# Patient Record
Sex: Female | Born: 1970 | Race: White | Hispanic: No | Marital: Married | Smoking: Current every day smoker
Health system: Southern US, Community
[De-identification: ages and names within clinical notes are randomized; demographics above are authoritative.]

## PROBLEM LIST (undated history)

## (undated) DIAGNOSIS — C801 Malignant (primary) neoplasm, unspecified: Secondary | ICD-10-CM

## (undated) DIAGNOSIS — C50919 Malignant neoplasm of unspecified site of unspecified female breast: Secondary | ICD-10-CM

## (undated) DIAGNOSIS — I10 Essential (primary) hypertension: Secondary | ICD-10-CM

## (undated) DIAGNOSIS — Z923 Personal history of irradiation: Secondary | ICD-10-CM

## (undated) DIAGNOSIS — K219 Gastro-esophageal reflux disease without esophagitis: Secondary | ICD-10-CM

## (undated) DIAGNOSIS — G459 Transient cerebral ischemic attack, unspecified: Secondary | ICD-10-CM

## (undated) HISTORY — DX: Transient cerebral ischemic attack, unspecified: G45.9

## (undated) HISTORY — DX: Essential (primary) hypertension: I10

## (undated) HISTORY — DX: Gastro-esophageal reflux disease without esophagitis: K21.9

## (undated) HISTORY — DX: Malignant (primary) neoplasm, unspecified: C80.1

---
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
## 2008-04-01 ENCOUNTER — Emergency Department: Payer: Self-pay | Admitting: Emergency Medicine

## 2011-06-28 ENCOUNTER — Ambulatory Visit: Payer: Self-pay | Admitting: Internal Medicine

## 2011-07-06 ENCOUNTER — Ambulatory Visit: Payer: Self-pay | Admitting: Internal Medicine

## 2012-02-25 ENCOUNTER — Emergency Department: Payer: Self-pay | Admitting: Unknown Physician Specialty

## 2012-02-25 LAB — COMPREHENSIVE METABOLIC PANEL
Anion Gap: 10 (ref 7–16)
BUN: 8 mg/dL (ref 7–18)
Calcium, Total: 8.9 mg/dL (ref 8.5–10.1)
Creatinine: 0.67 mg/dL (ref 0.60–1.30)
EGFR (African American): >60
EGFR (Non-African Amer.): >60
SGPT (ALT): 16 U/L
Sodium: 137 mmol/L (ref 136–145)

## 2012-02-25 LAB — CBC
HCT: 41.9 % (ref 35.0–47.0)
MCH: 33.3 pg (ref 26.0–34.0)
MCHC: 33.6 g/dL (ref 32.0–36.0)
Platelet: 341 10*3/uL (ref 150–440)

## 2012-02-27 ENCOUNTER — Ambulatory Visit: Payer: Self-pay | Admitting: Internal Medicine

## 2012-09-24 DIAGNOSIS — C801 Malignant (primary) neoplasm, unspecified: Secondary | ICD-10-CM

## 2012-09-24 DIAGNOSIS — Z923 Personal history of irradiation: Secondary | ICD-10-CM

## 2012-09-24 HISTORY — PX: BREAST LUMPECTOMY: SHX2

## 2012-09-24 HISTORY — DX: Malignant (primary) neoplasm, unspecified: C80.1

## 2012-09-24 HISTORY — PX: BREAST BIOPSY: SHX20

## 2012-09-24 HISTORY — DX: Personal history of irradiation: Z92.3

## 2013-01-21 ENCOUNTER — Emergency Department: Payer: Self-pay

## 2013-01-21 LAB — CBC
HCT: 39.3 % (ref 35.0–47.0)
MCHC: 33.9 g/dL (ref 32.0–36.0)
RBC: 4.14 10*6/uL (ref 3.80–5.20)
RDW: 13.4 % (ref 11.5–14.5)

## 2013-01-21 LAB — BASIC METABOLIC PANEL
Anion Gap: 6 — ABNORMAL LOW (ref 7–16)
BUN: 7 mg/dL (ref 7–18)
Chloride: 106 mmol/L (ref 98–107)
Creatinine: 0.59 mg/dL — ABNORMAL LOW (ref 0.60–1.30)
EGFR (African American): >60
EGFR (Non-African Amer.): >60
Glucose: 89 mg/dL (ref 65–99)
Osmolality: 271 (ref 275–301)
Potassium: 4.3 mmol/L (ref 3.5–5.1)
Sodium: 137 mmol/L (ref 136–145)

## 2013-01-21 LAB — TROPONIN I: Troponin-I: <0.02 ng/mL

## 2013-04-23 ENCOUNTER — Ambulatory Visit: Payer: Self-pay | Admitting: Internal Medicine

## 2013-04-28 ENCOUNTER — Ambulatory Visit: Payer: Self-pay | Admitting: Internal Medicine

## 2013-05-05 ENCOUNTER — Ambulatory Visit: Payer: Self-pay | Admitting: General Surgery

## 2013-05-26 ENCOUNTER — Encounter: Payer: Self-pay | Admitting: *Deleted

## 2013-06-02 ENCOUNTER — Encounter: Payer: Self-pay | Admitting: General Surgery

## 2013-06-02 ENCOUNTER — Ambulatory Visit (INDEPENDENT_AMBULATORY_CARE_PROVIDER_SITE_OTHER): Payer: Medicaid Other | Admitting: General Surgery

## 2013-06-02 VITALS — BP 118/68 | HR 80 | Resp 14 | Ht 65.0 in | Wt 169.0 lb

## 2013-06-02 DIAGNOSIS — R920 Mammographic microcalcification found on diagnostic imaging of breast: Secondary | ICD-10-CM

## 2013-06-02 NOTE — Patient Instructions (Addendum)
Patient to be schedule for a right breast stereotactic biopsy.   Breast Biopsy, Stereotactic A stereotactic breast biopsy takes a tissue sample from the breast with a special instrument. This is done when:  The problem (lump, abnormality, mass) can be seen on X-ray, but not felt on physical exam.  Suspicious, small calcium deposits (calcifications) are seen in the breast.  There is a change in shape or appearance of the breast, thickening, or asymmetry on mammogram (breast X-ray).  You have nipple changes (unusual or bloody discharge, crusting, retraction, dimpling).  Your caregiver is making a surgical diagnosis. The biopsy may be done on a special table, with your face down and your breasts placed through openings in the table. Computerized imaging (special form of X-rays) is used. The images are not obtained using regular X-ray film. So, exposure to radiation is reduced. Images are seen through several different angles. The surgeon removes small pieces of the suspicious tissue through a hollow needle. The tissue will be sent to the lab for analysis. The surgeon can look at the pictures right away, rather than wait for an X-ray to be developed. Your caregiver can mark the lesions (abnormal tissue formations) electronically. Then the computer can tell exactly where the problem is, or if it has moved. BENEFITS OF THE PROCEDURE  This is a good way to see if tiny lumps, abnormal looking tissue, or calcium deposits that you cannot feel are cancerous or require further treatment or follow-up.  Needle biopsy is a simple procedure. It may be performed in an outpatient imaging center. This means you have the procedure and go home the same day, without checking into a hospital.  It is less painful than open surgery. The results are as accurate as when a tissue sample is removed surgically.  The procedure is faster, less expensive, less invasive, does not distort the breast, and leaves little or no  scar.  Breast defects, which can make future mammograms hard to read and interpret, do not remain.  Recovery time is brief. Patients can soon resume their normal activities.  Using VAD (vacuum assisted device) may make it possible to remove entire lesions.  A breast biopsy can indicate if you need surgery, other treatment, or combined treatment. LET YOUR CAREGIVER KNOW ABOUT:  Allergies.  Medications taken, including herbs, eye drops, over-the-counter medications, and creams.  Use of steroids (by mouth or creams).  Previous problems with anesthetics or numbing medication.  If you are taking blood thinner medications or aspirin.  Possibility of pregnancy, if this applies.  History of blood clots (thrombophlebitis).  History of bleeding or blood problems.  Previous surgery.  Other health problems. RISKS AND COMPLICATIONS  Infection (germ growing in the wound). This can often be treated with antibiotics.  Bleeding, following surgery. Your surgeon takes every precaution to keep this from happening.  There is some concern that if a cancerous mass is present, cancer cells might be spread by the needle. Whether this actually happens is not known. It does not appear to be a significant risk.  X-ray guided breast biopsy is not infallible (not always correct). The problem may be missed or the extent of the problem may be underestimated. This would mean the biopsy did not manage to remove a piece of the diseased tissue or enough of the diseased tissue.  Lesions present, with calcium deposits scattered throughout the breast, are difficult to target by stereotactic method. Those lesions near the chest wall also are hard to learn about by this  method. If the mammogram shows only a vague change in tissue density, but no definite mass or nodule, the X-ray guided method may not be successful. Occasionally, even after a successful biopsy, the tissue diagnosis remains uncertain. A surgical  biopsy will be needed, if abnormal or precancerous cells are found on core biopsy.  Altering or deforming of the breast.  Unable to find, or missing the lesion.  Rarely, the needle may go through the chest wall into the lung area. TWO BIOPSY INSTRUMENTS MAY BE USED IN THE PROCEDURE The conventional biopsy device (core needle biopsy device) consists of an inner needle with a trough extending from it at one end, and an overlying sheath. It is attached to a spring-loaded mechanism that propels it forward. The trough fills with tissue. The outer sheath instantly moves forward to cut the tissue and keep it in the trough. Each sample is obtained in a fraction of a second. It is necessary to withdraw the needle after each sample is taken to collect the tissue.  A newer type of instrument, the VAD (vacuum assisted device), uses vacuum pressure to pull breast tissue into a needle and remove it. The needle does not need to be withdrawn after each sampling. Another advantage is that biopsies are obtained in an orderly manner, by rotating the device. This helps make sure that the entire area of interest will be sampled. When using the automated core biopsy needle, sampling is more random.  FOR COMFORT DURING THE TEST  Relax as much as possible.  Try to follow instructions, to speed up the test.  Let your caregiver know if you are uncomfortable, anxious, or in pain. PROCEDURE  You are awake during the procedure, and you go home the same day (outpatient). A specially trained radiologist will do this procedure. First, the skin is cleansed. Then, it is injected with a local anesthetic. A small nick is made in the skin, and the tip of the biopsy needle is put into the calculated site of the lesion. A special mammography machine uses ionizing radiation to help guide the radiologist's instrument to the site of the abnormal growth. At this point, stereo images are again obtained, to confirm that the needle tip is at  the problem area. Usually 5 to 10 samples are collected when doing a core biopsy. At least 12 are collected when using the vacuum assisted device (VAD). Then, a final set of images is obtained. If they show that the lesion has been mostly or completely removed, a small clip is left at the biopsy site. This is so that it can be easily located, in case the lesion turns out to be cancer. Afterward, the skin opening is stitched (sutured) or taped closed, and covered with a dressing. Your caregiver may apply a pressure dressing and an ice pack, to prevent bleeding and swelling in the breast.  X-ray guided breast biopsy can take 30 minutes to 1 hour, or more. The X-rays usually have no side effects, and no radiation remains in your body. There is usually little or no pain. Usually no scar is left from the tiny skin incision. Many women find that the major discomfort of the procedure is from lying on their stomach, or staying in 1 position for the length of the procedure. This discomfort may be reduced by carefully placed cushions. You should wear a good support bra to the procedure. You will be asked to remove jewelry, dentures, eye glasses, metal objects, or clothing that might interfere with  the X-ray images. You may want to have someone with you, to take you home after the procedure. AFTER THE PROCEDURE   After surgery, if you are doing well and have no problems, you will be allowed to go home.  You may resume your regular diet, or as directed by your caregiver. HOME CARE INSTRUCTIONS   Follow your caregiver's recommendations for medications, care of the biopsy site, follow-up appointments, and further treatment.  Only take over-the-counter or prescription medicines for pain, discomfort, or fever as directed by your caregiver.  An ice pack applied to the affected area may help with discomfort and keep the swelling down.  Change dressings as directed.  Wear a good support bra for as long as your  caregiver recommends.  Avoid strenuous activity for at least 24 hours, or as advised by your caregiver. Finding out the results of your test Not all test results are available during your visit. If your test results are not back during the visit, make an appointment with your caregiver to find out the results. Do not assume everything is normal if you have not heard from your caregiver or the medical facility. It is important for you to follow up on all of your test results.  SEEK MEDICAL CARE IF:   You develop a rash.  You have problems with your medicines.  You become lightheaded or dizzy. SEEK IMMEDIATE MEDICAL CARE IF:   There is increased bleeding (more than a small spot) from the biopsy site.  You notice redness, swelling, or increasing pain in the wound.  Pus is coming from the wound.  You have a fever.  You notice a bad smell coming from the wound or dressing.  You develop shortness of breath.  You develop chest pain.  You pass out. Document Released: 06/09/2003 Document Revised: 12/03/2011 Document Reviewed: 07/15/2009 [facility] Patient Information 2014 [place], [place].  Patient has been scheduled for a right breast stereotactic biopsy at [facility] for 06-22-13 at 1 pm. She will check-in at the [facility] at 12:30 pm. This patient is aware of date, time, and instructions. Patient verbalizes understanding.

## 2013-06-02 NOTE — Progress Notes (Signed)
Patient ID: Valerie Mcdaniel, female   DOB: 14-Mar-1971, 42 y.o.   MRN: [id number]  Chief Complaint  Patient presents with  . Breast Problem    breast pain    HPI Valerie Mcdaniel is a 42 y.o. female.  who presents for a breast evaluation. The most recent mammogram was done on 04-28-13 with a birad category 4. Patient does not perform regular self breast checks and does not get regular mammograms done. The patient states she has had left breast pain for approximately 2 months. The pain only is there when the breast is bumped or touched. She denies any other breast problems. She had a maternal aunt that had a history of breast cancer who is now deceased.   HPI  Past Medical History  Diagnosis Date  . GERD (gastroesophageal reflux disease)   . Hypertension     Past Surgical History  Procedure Laterality Date  . Cesarean section  March 1997  . Neck lesion biopsy  1989    Family History  Problem Relation Age of Onset  . Cancer Father     lung  . Cancer Maternal Aunt     breast  . Cancer Maternal Grandmother     throat    Social History History  Substance Use Topics  . Smoking status: Current Every Day Smoker -- 1.00 packs/day    Types: Cigarettes  . Smokeless tobacco: Not on file  . Alcohol Use: No    No Known Allergies  Current Outpatient Prescriptions  Medication Sig Dispense Refill  . aspirin 325 MG tablet Take 325 mg by mouth daily.      . Calcium Carbonate-Vit D-Min (CALCIUM 1200 PO) Take 1 tablet by mouth daily.      . famotidine (PEPCID) 20 MG tablet Take 20 mg by mouth daily.      Marland Kitchen lisinopril (PRINIVIL,ZESTRIL) 10 MG tablet Take 10 mg by mouth daily.      . Multiple Vitamin (MULTIVITAMIN) capsule Take 1 capsule by mouth daily.       No current facility-administered medications for this visit.    Review of Systems Review of Systems  Constitutional: Negative.   Respiratory: Negative.   Cardiovascular: Negative.     Blood pressure 118/68, pulse 80, resp. rate 14,  height 5\' 5"  (1.651 m), weight 169 lb (76.658 kg), last menstrual period 05/12/2013.  Physical Exam Physical Exam  Constitutional: She is oriented to person, place, and time. She appears well-developed and well-nourished.  Eyes: Conjunctivae are normal. No scleral icterus.  Neck: No thyromegaly present.  Cardiovascular: Normal rate, regular rhythm and normal heart sounds.   No murmur heard. Pulmonary/Chest: Effort normal and breath sounds normal. Right breast exhibits no inverted nipple, no mass, no nipple discharge, no skin change and no tenderness. Left breast exhibits no inverted nipple, no mass, no nipple discharge, no skin change and no tenderness.  Lymphadenopathy:    She has no cervical adenopathy.    She has no axillary adenopathy.  Neurological: She is alert and oriented to person, place, and time.  Skin: Skin is warm and dry.    Data Reviewed  Mammogram reviewed. Cluster of calcifications noted lateral right breast.   Assessment    Abnormal microcalcification's right breast needs stereotactic biopsy.    Plan    Patient to have a right breast stereotactic biopsy. Procedure explained to patient. Patient agreeable.     Patient has been scheduled for a right breast stereotactic biopsy at [facility] for 06-22-13 at 1 pm. She will  check-in at the [facility] at 12:30 pm. This patient is aware of date, time, and instructions. Patient verbalizes understanding.   Kadesha Virrueta G 06/02/2013, 6:35 PM

## 2013-06-22 ENCOUNTER — Ambulatory Visit: Payer: Self-pay | Admitting: General Surgery

## 2013-06-22 DIAGNOSIS — R920 Mammographic microcalcification found on diagnostic imaging of breast: Secondary | ICD-10-CM

## 2013-06-24 ENCOUNTER — Encounter: Payer: Self-pay | Admitting: General Surgery

## 2013-06-24 ENCOUNTER — Ambulatory Visit (INDEPENDENT_AMBULATORY_CARE_PROVIDER_SITE_OTHER): Payer: Medicaid Other | Admitting: General Surgery

## 2013-06-24 VITALS — BP 120/70 | HR 70 | Resp 14 | Ht 65.0 in | Wt 170.0 lb

## 2013-06-24 DIAGNOSIS — D0590 Unspecified type of carcinoma in situ of unspecified breast: Secondary | ICD-10-CM

## 2013-06-24 DIAGNOSIS — D0591 Unspecified type of carcinoma in situ of right breast: Secondary | ICD-10-CM

## 2013-06-24 NOTE — Progress Notes (Signed)
Patient here today for follow up post right breast biopsy.  Steristrip in place and aware it may come off in one week.  Minimal bruising noted.  The patient is aware that a heating pad may be used for comfort as needed.    Pathology revealed DCIS. Pt was advised fully on this. Treatment for DCIS-lumpectomy and radiation and possible antihormonal therapy discussed and explained to her. ER/PR are pending. She is also aware lumpectomy specimen may show an invasive component which will necessitate SN after.   Procedure and risks explained to her. She is agreeable.

## 2013-06-25 ENCOUNTER — Encounter: Payer: Self-pay | Admitting: General Surgery

## 2013-06-25 ENCOUNTER — Other Ambulatory Visit: Payer: Self-pay | Admitting: General Surgery

## 2013-06-25 DIAGNOSIS — D0591 Unspecified type of carcinoma in situ of right breast: Secondary | ICD-10-CM

## 2013-06-26 ENCOUNTER — Encounter: Payer: Self-pay | Admitting: General Surgery

## 2013-06-26 DIAGNOSIS — D0590 Unspecified type of carcinoma in situ of unspecified breast: Secondary | ICD-10-CM | POA: Insufficient documentation

## 2013-06-26 LAB — PATHOLOGY REPORT

## 2013-06-29 ENCOUNTER — Ambulatory Visit: Payer: Medicaid Other

## 2013-06-29 ENCOUNTER — Encounter: Payer: Self-pay | Admitting: General Surgery

## 2013-06-29 ENCOUNTER — Ambulatory Visit: Payer: Self-pay | Admitting: General Surgery

## 2013-06-29 DIAGNOSIS — D0590 Unspecified type of carcinoma in situ of unspecified breast: Secondary | ICD-10-CM

## 2013-06-29 HISTORY — PX: BREAST SURGERY: SHX581

## 2013-07-01 LAB — PATHOLOGY REPORT

## 2013-07-02 ENCOUNTER — Encounter: Payer: Self-pay | Admitting: General Surgery

## 2013-07-02 ENCOUNTER — Telehealth: Payer: Self-pay | Admitting: *Deleted

## 2013-07-02 NOTE — Telephone Encounter (Signed)
Per Dr. Evette Cristal the margins are clear and no invasive cancer, patient pleased. Follow up as scheduled next week.

## 2013-07-08 ENCOUNTER — Encounter: Payer: Self-pay | Admitting: General Surgery

## 2013-07-08 ENCOUNTER — Ambulatory Visit (INDEPENDENT_AMBULATORY_CARE_PROVIDER_SITE_OTHER): Payer: Medicaid Other | Admitting: General Surgery

## 2013-07-08 VITALS — BP 130/64 | HR 88 | Resp 14 | Ht 65.0 in | Wt 168.0 lb

## 2013-07-08 DIAGNOSIS — D0590 Unspecified type of carcinoma in situ of unspecified breast: Secondary | ICD-10-CM

## 2013-07-08 DIAGNOSIS — D0591 Unspecified type of carcinoma in situ of right breast: Secondary | ICD-10-CM

## 2013-07-08 NOTE — Progress Notes (Signed)
Patient here today for follow up post right breast lumpectomy done on 06/29/13.  No complaints except for some itching. Right breast lumpectomy site is clean and healing well.  Path- margins clear, no invasive CA. Arrange for radiation oncology consult. Believe patient would be a candidate for mammosite. This was discussed with patient in full.

## 2013-07-08 NOTE — Patient Instructions (Signed)
Follow up appointment to be announced.  

## 2013-07-09 ENCOUNTER — Telehealth: Payer: Self-pay | Admitting: *Deleted

## 2013-07-09 DIAGNOSIS — D0590 Unspecified type of carcinoma in situ of unspecified breast: Secondary | ICD-10-CM

## 2013-07-09 NOTE — Telephone Encounter (Signed)
Patient has been scheduled for an appointment to see Dr. Rushie Chestnut at the [facility] for 07-13-13 at 8 am. She is aware of date, time, and instructions.   Records have been forwarded to the Cancer Center for Dr. Kipp Laurence review.

## 2013-07-13 ENCOUNTER — Ambulatory Visit: Payer: Self-pay | Admitting: Radiation Oncology

## 2013-07-16 ENCOUNTER — Encounter: Payer: Self-pay | Admitting: General Surgery

## 2013-07-21 ENCOUNTER — Telehealth: Payer: Self-pay | Admitting: *Deleted

## 2013-07-21 NOTE — Telephone Encounter (Signed)
Patient has decided on full breast radiation and she starts next week. I told her once she is done she will need a follow up here to call back after radiation completed.

## 2013-07-25 ENCOUNTER — Ambulatory Visit: Payer: Self-pay | Admitting: Radiation Oncology

## 2013-08-05 LAB — CBC CANCER CENTER
Basophil %: 0.6 %
Eosinophil #: 0.5 x10 3/mm (ref 0.0–0.7)
Eosinophil %: 3.2 %
HGB: 15 g/dL (ref 12.0–16.0)
Lymphocyte #: 2.2 x10 3/mm (ref 1.0–3.6)
Lymphocyte %: 14.4 %
MCH: 33 pg (ref 26.0–34.0)
MCHC: 33.7 g/dL (ref 32.0–36.0)
Monocyte #: 0.7 x10 3/mm (ref 0.2–0.9)
Neutrophil #: 11.7 x10 3/mm — ABNORMAL HIGH (ref 1.4–6.5)
Neutrophil %: 77.3 %
Platelet: 387 x10 3/mm (ref 150–440)
RDW: 12.8 % (ref 11.5–14.5)

## 2013-08-12 LAB — CBC CANCER CENTER
Basophil #: 0 x10 3/mm (ref 0.0–0.1)
Basophil %: 0.2 %
Lymphocyte %: 14.4 %
MCHC: 33.7 g/dL (ref 32.0–36.0)
MCV: 98 fL (ref 80–100)
Monocyte #: 1 x10 3/mm — ABNORMAL HIGH (ref 0.2–0.9)
Monocyte %: 6.1 %
Neutrophil #: 12.4 x10 3/mm — ABNORMAL HIGH (ref 1.4–6.5)
Neutrophil %: 76.3 %
RDW: 13.3 % (ref 11.5–14.5)
WBC: 16.2 x10 3/mm — ABNORMAL HIGH (ref 3.6–11.0)

## 2013-08-19 LAB — CBC CANCER CENTER
Basophil %: 0.5 %
Eosinophil #: 0.5 x10 3/mm (ref 0.0–0.7)
HCT: 41.4 % (ref 35.0–47.0)
HGB: 14 g/dL (ref 12.0–16.0)
Lymphocyte #: 1.7 x10 3/mm (ref 1.0–3.6)
Lymphocyte %: 13.5 %
MCH: 32.8 pg (ref 26.0–34.0)
MCHC: 33.8 g/dL (ref 32.0–36.0)
Monocyte %: 6.5 %
Neutrophil #: 9.4 x10 3/mm — ABNORMAL HIGH (ref 1.4–6.5)
Neutrophil %: 75.6 %
RDW: 12.8 % (ref 11.5–14.5)
WBC: 12.4 x10 3/mm — ABNORMAL HIGH (ref 3.6–11.0)

## 2013-08-24 ENCOUNTER — Ambulatory Visit: Payer: Self-pay | Admitting: Radiation Oncology

## 2013-08-26 LAB — CBC CANCER CENTER
Basophil %: 0.9 %
Eosinophil %: 6.1 %
HGB: 14.5 g/dL (ref 12.0–16.0)
Lymphocyte #: 1.6 x10 3/mm (ref 1.0–3.6)
MCH: 33.3 pg (ref 26.0–34.0)
MCHC: 34.2 g/dL (ref 32.0–36.0)
Monocyte #: 0.7 x10 3/mm (ref 0.2–0.9)
Neutrophil #: 6.7 x10 3/mm — ABNORMAL HIGH (ref 1.4–6.5)
Neutrophil %: 68.9 %
Platelet: 393 x10 3/mm (ref 150–440)
RBC: 4.34 10*6/uL (ref 3.80–5.20)
RDW: 13 % (ref 11.5–14.5)
WBC: 9.7 x10 3/mm (ref 3.6–11.0)

## 2013-09-02 LAB — CBC CANCER CENTER
Basophil #: 0.1 x10 3/mm (ref 0.0–0.1)
Basophil %: 0.4 %
Eosinophil #: 0.7 x10 3/mm (ref 0.0–0.7)
Eosinophil %: 5.2 %
Lymphocyte #: 1.5 x10 3/mm (ref 1.0–3.6)
MCV: 98 fL (ref 80–100)
Monocyte #: 0.8 x10 3/mm (ref 0.2–0.9)
Monocyte %: 5.9 %
Neutrophil #: 10.1 x10 3/mm — ABNORMAL HIGH (ref 1.4–6.5)
RBC: 4.37 10*6/uL (ref 3.80–5.20)
RDW: 12.7 % (ref 11.5–14.5)
WBC: 13.2 x10 3/mm — ABNORMAL HIGH (ref 3.6–11.0)

## 2013-09-09 LAB — CBC CANCER CENTER
Basophil %: 0.3 %
Eosinophil %: 6 %
HGB: 14.3 g/dL (ref 12.0–16.0)
Lymphocyte %: 12.3 %
MCV: 97 fL (ref 80–100)
RDW: 13.2 % (ref 11.5–14.5)

## 2013-09-24 ENCOUNTER — Ambulatory Visit: Payer: Self-pay | Admitting: Radiation Oncology

## 2013-10-20 ENCOUNTER — Encounter: Payer: Self-pay | Admitting: General Surgery

## 2013-10-20 ENCOUNTER — Ambulatory Visit (INDEPENDENT_AMBULATORY_CARE_PROVIDER_SITE_OTHER): Payer: Medicaid Other | Admitting: General Surgery

## 2013-10-20 VITALS — BP 142/78 | HR 88 | Resp 16 | Ht 65.0 in | Wt 174.2 lb

## 2013-10-20 DIAGNOSIS — D0590 Unspecified type of carcinoma in situ of unspecified breast: Secondary | ICD-10-CM

## 2013-10-20 DIAGNOSIS — D0510 Intraductal carcinoma in situ of unspecified breast: Secondary | ICD-10-CM

## 2013-10-20 MED ORDER — TAMOXIFEN CITRATE 20 MG PO TABS: 20.0000 mg | ORAL_TABLET | Freq: Every day | ORAL | Status: DC

## 2013-10-20 NOTE — Patient Instructions (Addendum)
Continue self breast exams. Call office for any new breast issues or concerns.  The patient has been asked to return to the office in two months for a unilateral right breast diagnostic mammogram. This has been scheduled at the [facility] on 12-08-13 at 9:40 am (arrive 10 minutes early). She is aware of date, time, and instructions.   Patient to return to the office once mammogram is completed.

## 2013-10-20 NOTE — Progress Notes (Signed)
Patient ID: Valerie Mcdaniel, female   DOB: 07-30-1971, 43 y.o.   MRN: [id number]  Chief Complaint  Patient presents with  . Follow-up    breast cancer and her radiation was completed 09-18-13    HPI Valerie Mcdaniel is a 43 y.o. female.  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 She does admit to occasional shooting pain in right breast but otherwise no complaints. Genetic testing was completed through the [place]. HPI  Past Medical History  Diagnosis Date  . GERD (gastroesophageal reflux disease)   . Hypertension   . Cancer 2014    breast    Past Surgical History  Procedure Laterality Date  . Cesarean section  March 1997  . Neck lesion biopsy  1989  . Breast surgery Right 06/29/13    lumpectomy    Family History  Problem Relation Age of Onset  . Cancer Father     lung  . Cancer Maternal Aunt     breast  . Cancer Maternal Grandmother     throat    Social History History  Substance Use Topics  . Smoking status: Current Every Day Smoker -- 1.00 packs/day    Types: Cigarettes  . Smokeless tobacco: Never Used  . Alcohol Use: No    No Known Allergies  Current Outpatient Prescriptions  Medication Sig Dispense Refill  . aspirin 325 MG tablet Take 325 mg by mouth daily.      . Calcium Carbonate-Vit D-Min (CALCIUM 1200 PO) Take 1 tablet by mouth daily.      . famotidine (PEPCID) 20 MG tablet Take 20 mg by mouth daily.      Marland Kitchen lisinopril (PRINIVIL,ZESTRIL) 10 MG tablet Take 10 mg by mouth daily.      . Multiple Vitamin (MULTIVITAMIN) capsule Take 1 capsule by mouth daily.      . tamoxifen (NOLVADEX) 20 MG tablet Take 1 tablet (20 mg total) by mouth daily.  90 tablet  3   No current facility-administered medications for this visit.    Review of Systems Review of Systems  Constitutional: Negative.   Respiratory: Negative.   Cardiovascular: Negative.     Blood pressure 142/78, pulse 88, resp. rate 16, height 5\' 5"   (1.651 m), weight 174 lb 3.2 oz (79.017 kg), last menstrual period 09/11/2013.  Physical Exam Physical Exam  Constitutional: She is oriented to person, place, and time. She appears well-developed and well-nourished.  Neck: Neck supple.  Pulmonary/Chest: Right breast exhibits skin change. Right breast exhibits no inverted nipple, no mass, no nipple discharge and no tenderness. Left breast exhibits no inverted nipple, no mass, no nipple discharge, no skin change and no tenderness.  Right breast incision healing well. Mild radiation skin changes right breast    Lymphadenopathy:    She has no cervical adenopathy.    She has no axillary adenopathy.  Neurological: She is alert and oriented to person, place, and time.  Skin: Skin is warm and dry.    Data Reviewed Completed her whole breast radiation therapy, she has not started hormone therapy yet.  Assessment    Stable exam. DCIS right breast    Plan    Tamoxifen risk and benefits discussed with the patient, prescription sent to pharmacy.    The patient has been asked to return to the office in two months for a unilateral right breast diagnostic mammogram. This has been scheduled at the [facility] on 12-08-13 at 9:40 am (arrive 10  minutes early). She is aware of date, time, and instructions.   Patient to return to the office after mammogram is completed.   Valerie Mcdaniel 10/21/2013, 6:00 AM

## 2013-10-21 ENCOUNTER — Encounter: Payer: Self-pay | Admitting: General Surgery

## 2013-10-22 ENCOUNTER — Telehealth: Payer: Self-pay | Admitting: *Deleted

## 2013-10-22 NOTE — Telephone Encounter (Signed)
Pt states she took the medication yesterday and 30 min to 1 hour later she had the symptoms and her eye was puffy, by the end of the day she was OK, she took the medication today and same thing happended. Per Dr Jamal Collin pt advised not to take medication for now. She is to take it on Monday and call the office with side effects good or bad, pt agrees.

## 2013-10-22 NOTE — Telephone Encounter (Signed)
Pt was seen here earlier this week and Dr.Sankar started her on Tamoxifen and she started it yesterday and now she has a "draw" feeling in her left side of the face and experiencing numbness in the arms, hands, and lips. She was just a little concerned if this was the medication that could be causing this.

## 2013-10-25 ENCOUNTER — Ambulatory Visit: Payer: Self-pay | Admitting: Radiation Oncology

## 2013-10-27 ENCOUNTER — Telehealth: Payer: Self-pay | Admitting: *Deleted

## 2013-10-27 NOTE — Telephone Encounter (Signed)
Advised not to take medication until Dr Jamal Collin aware.

## 2013-10-27 NOTE — Telephone Encounter (Signed)
Pt just wanted to call and let you know that the Tamoxifen is doing the same symptoms again, she started taking it on Monday 10/26/13 and today she has the "draw" feeling to her face again.

## 2013-12-08 ENCOUNTER — Ambulatory Visit: Payer: Self-pay | Admitting: General Surgery

## 2013-12-09 ENCOUNTER — Encounter: Payer: Self-pay | Admitting: General Surgery

## 2013-12-15 ENCOUNTER — Encounter: Payer: Self-pay | Admitting: General Surgery

## 2013-12-15 ENCOUNTER — Ambulatory Visit (INDEPENDENT_AMBULATORY_CARE_PROVIDER_SITE_OTHER): Payer: Medicaid Other | Admitting: General Surgery

## 2013-12-15 ENCOUNTER — Other Ambulatory Visit: Payer: Self-pay | Admitting: *Deleted

## 2013-12-15 ENCOUNTER — Ambulatory Visit: Payer: Self-pay | Admitting: Internal Medicine

## 2013-12-15 VITALS — BP 130/78 | HR 76 | Resp 12 | Ht 65.0 in | Wt 173.0 lb

## 2013-12-15 DIAGNOSIS — D0510 Intraductal carcinoma in situ of unspecified breast: Secondary | ICD-10-CM

## 2013-12-15 DIAGNOSIS — D0590 Unspecified type of carcinoma in situ of unspecified breast: Secondary | ICD-10-CM

## 2013-12-15 NOTE — Patient Instructions (Signed)
Follow up in 5 months with bilateral diagnostic mammogram. Referral to oncology for consideration of other options for hormonal therapy. The patient is aware to call back for any questions or concerns.

## 2013-12-15 NOTE — Progress Notes (Signed)
Patient ID: Valerie Mcdaniel, female   DOB: [birth year], 43 y.o.   MRN: [id number]  Chief Complaint  Patient presents with  . Follow-up    right diagnostic mammogram     HPI Valerie Mcdaniel is a 43 y.o. female who presents for a breast evaluation. The most recent mammogram was done on 12/08/13. Patient does perform regular self breast checks and gets regular mammograms done. The patient denies any new problems with the breasts at this time. As per prior discussion the patient has significant side effects with Tamoxifen and is no longer taking it.    HPI  Past Medical History  Diagnosis Date  . GERD (gastroesophageal reflux disease)   . Hypertension   . Cancer 2014    breast    Past Surgical History  Procedure Laterality Date  . Cesarean section  March 1997  . Neck lesion biopsy  1989  . Breast surgery Right 06/29/13    lumpectomy    Family History  Problem Relation Age of Onset  . Cancer Father     lung  . Cancer Maternal Aunt     breast  . Cancer Maternal Grandmother     throat    Social History History  Substance Use Topics  . Smoking status: Current Every Day Smoker -- 1.00 packs/day    Types: Cigarettes  . Smokeless tobacco: Never Used  . Alcohol Use: No    No Known Allergies  Current Outpatient Prescriptions  Medication Sig Dispense Refill  . aspirin 325 MG tablet Take 325 mg by mouth daily.      . Calcium Carbonate-Vit D-Min (CALCIUM 1200 PO) Take 1 tablet by mouth daily.      . famotidine (PEPCID) 20 MG tablet Take 20 mg by mouth daily.      Marland Kitchen lisinopril (PRINIVIL,ZESTRIL) 10 MG tablet Take 10 mg by mouth daily.      . Multiple Vitamin (MULTIVITAMIN) capsule Take 1 capsule by mouth daily.       No current facility-administered medications for this visit.    Review of Systems Review of Systems  Constitutional: Negative.   Respiratory: Negative.   Cardiovascular: Negative.     Blood pressure 130/78, pulse 76, resp. rate 12, height 5\' 5"  (1.651 m), weight  173 lb (78.472 kg), last menstrual period 12/09/2013.  Physical Exam Physical Exam  Constitutional: She is oriented to person, place, and time. She appears well-developed and well-nourished.  Neck: Neck supple. No thyromegaly present.  Cardiovascular: Normal rate, regular rhythm and normal heart sounds.   No murmur heard. Pulmonary/Chest: Effort normal and breath sounds normal. Right breast exhibits no inverted nipple, no mass, no nipple discharge, no skin change and no tenderness. Left breast exhibits no inverted nipple, no mass, no nipple discharge, no skin change and no tenderness.  Well healed lumpectomy site and mild skin change from radiation of right breast.   Lymphadenopathy:    She has no cervical adenopathy.    She has no axillary adenopathy.  Neurological: She is alert and oriented to person, place, and time.  Skin: Skin is warm and dry.    Data Reviewed  Mammogram reviewed.   Assessment    Exam stable. DCIS right breast. ER PR positive. Patient with side effects related to Tamoxifen.     Plan    Follow up in 5 months with bilateral diagnostic mammogram. Referral to oncology for consideration of other options for hormonal therapy.        Valerie,SEEPLAPUTHUR Mcdaniel 12/15/2013, 1:54 PM

## 2013-12-15 NOTE — Progress Notes (Signed)
Patient has been scheduled for an appointment with Dr. Ma Hillock at the [facility] for 12-22-13 at 3 pm. This patient is aware of date, time, and instructions.

## 2013-12-22 ENCOUNTER — Ambulatory Visit: Payer: Self-pay | Admitting: Internal Medicine

## 2014-03-12 ENCOUNTER — Ambulatory Visit: Payer: Self-pay | Admitting: Internal Medicine

## 2014-04-12 ENCOUNTER — Ambulatory Visit (INDEPENDENT_AMBULATORY_CARE_PROVIDER_SITE_OTHER): Payer: Medicaid Other | Admitting: General Surgery

## 2014-04-12 ENCOUNTER — Encounter: Payer: Self-pay | Admitting: General Surgery

## 2014-04-12 VITALS — BP 118/62 | HR 70 | Resp 12 | Ht 65.0 in | Wt 172.0 lb

## 2014-04-12 DIAGNOSIS — D0590 Unspecified type of carcinoma in situ of unspecified breast: Secondary | ICD-10-CM

## 2014-04-12 DIAGNOSIS — D0511 Intraductal carcinoma in situ of right breast: Secondary | ICD-10-CM

## 2014-04-12 NOTE — Progress Notes (Signed)
Patient ID: Valerie Mcdaniel, female   DOB: 07-31-1971, 43 y.o.   MRN: [id number]  Chief Complaint  Patient presents with  . Other    right breast check     HPI Valerie Mcdaniel is a 43 y.o. female here today for a breast check. Patient states her right breast is a little puffy and the nipple looks larger . She had a right lumpectomy in 06/29/13. HPI  Past Medical History  Diagnosis Date  . GERD (gastroesophageal reflux disease)   . Hypertension   . Cancer 2014    breast    Past Surgical History  Procedure Laterality Date  . Cesarean section  March 1997  . Neck lesion biopsy  1989  . Breast surgery Right 06/29/13    lumpectomy    Family History  Problem Relation Age of Onset  . Cancer Father     lung  . Cancer Maternal Aunt     breast  . Cancer Maternal Grandmother     throat    Social History History  Substance Use Topics  . Smoking status: Current Every Day Smoker -- 1.00 packs/day    Types: Cigarettes  . Smokeless tobacco: Never Used  . Alcohol Use: No    No Known Allergies  Current Outpatient Prescriptions  Medication Sig Dispense Refill  . aspirin 325 MG tablet Take 325 mg by mouth daily.      . Calcium Carbonate-Vit D-Min (CALCIUM 1200 PO) Take 1 tablet by mouth daily.      . famotidine (PEPCID) 20 MG tablet Take 20 mg by mouth daily.      Marland Kitchen lisinopril (PRINIVIL,ZESTRIL) 10 MG tablet Take 10 mg by mouth daily.      . Multiple Vitamin (MULTIVITAMIN) capsule Take 1 capsule by mouth daily.       No current facility-administered medications for this visit.    Review of Systems Review of Systems  Blood pressure 118/62, pulse 70, resp. rate 12, height 5\' 5"  (1.651 m), weight 172 lb (78.019 kg), last menstrual period 04/09/2014.  Physical Exam Physical Exam  Constitutional: She is oriented to person, place, and time. She appears well-developed and well-nourished.  Neurological: She is alert and oriented to person, place, and time.  Skin: Skin is warm and  dry.  Lumpectomy site is well healed- mild radiation effect is noted, mostly a firm feel. Breast exam otherwise normal.No lymphadenopathy. Data Reviewed None   Assessment    No findings of concern. Pt reassured. Pt has had TIAs and is not taking the Tamoxifen.    Plan    Patient to return in September 2015 bilateral diagnotic mammogram as scheduled.        Valerie Mcdaniel 04/13/2014, 5:55 PM

## 2014-04-13 ENCOUNTER — Encounter: Payer: Self-pay | Admitting: General Surgery

## 2014-07-12 ENCOUNTER — Encounter: Payer: Self-pay | Admitting: General Surgery

## 2014-07-20 ENCOUNTER — Encounter: Payer: Self-pay | Admitting: General Surgery

## 2014-07-20 ENCOUNTER — Ambulatory Visit (INDEPENDENT_AMBULATORY_CARE_PROVIDER_SITE_OTHER): Payer: Medicaid Other | Admitting: General Surgery

## 2014-07-20 VITALS — BP 130/74 | HR 76 | Resp 14 | Ht 66.0 in | Wt 177.0 lb

## 2014-07-20 DIAGNOSIS — D0511 Intraductal carcinoma in situ of right breast: Secondary | ICD-10-CM

## 2014-07-20 NOTE — Progress Notes (Signed)
Patient ID: Valerie Mcdaniel, female   DOB: 03/10/1971, 43 y.o.   MRN: [id number]  Chief Complaint  Patient presents with  . Follow-up    mammogram    HPI Valerie Mcdaniel is a 43 y.o. female who presents for a breast cancer follow up. The most recent mammogram was done on 07/08/14. Patient does perform regular self breast checks and gets regular mammograms done. No complaints at this time.    HPI  Past Medical History  Diagnosis Date  . GERD (gastroesophageal reflux disease)   . Hypertension   . 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 Cesarean section  March 1997  . Neck lesion biopsy  1989  . Breast surgery Right 06/29/13    lumpectomy    Family History  Problem Relation Age of Onset  . Cancer Father     lung  . Cancer Maternal Aunt     breast  . Cancer Maternal Grandmother     throat    Social History History  Substance Use Topics  . Smoking status: Current Every Day Smoker -- 1.00 packs/day    Types: Cigarettes  . Smokeless tobacco: Never Used  . Alcohol Use: No    No Known Allergies  Current Outpatient Prescriptions  Medication Sig Dispense Refill  . aspirin 325 MG tablet Take 325 mg by mouth daily.      . Calcium Carbonate-Vit D-Min (CALCIUM 1200 PO) Take 1 tablet by mouth daily.      . famotidine (PEPCID) 20 MG tablet Take 20 mg by mouth daily.      Marland Kitchen lisinopril (PRINIVIL,ZESTRIL) 10 MG tablet Take 10 mg by mouth daily.      . Multiple Vitamin (MULTIVITAMIN) capsule Take 1 capsule by mouth daily.       No current facility-administered medications for this visit.    Review of Systems Review of Systems  Constitutional: Negative.   Respiratory: Negative.   Cardiovascular: Negative.     Blood pressure 130/74, pulse 76, resp. rate 14, height 5\' 6"  (1.676 m), weight 177 lb (80.287 kg), last menstrual period 06/24/2014.  Physical Exam Physical Exam  Constitutional: She is oriented to  person, place, and time. She appears well-developed and well-nourished.  Eyes: Conjunctivae are normal. No scleral icterus.  Neck: Neck supple. No thyromegaly present.  Cardiovascular: Normal rate, regular rhythm and normal heart sounds.   No murmur heard. Pulmonary/Chest: Effort normal and breath sounds normal. Right breast exhibits no inverted nipple, no mass, no nipple discharge, no skin change and no tenderness. Left breast exhibits no inverted nipple, no mass, no nipple discharge, no skin change and no tenderness.  Abdominal: Soft. Normal appearance and bowel sounds are normal. There is no hepatosplenomegaly. There is no tenderness. No hernia.  Lymphadenopathy:    She has no cervical adenopathy.    She has no axillary adenopathy.  Neurological: She is alert and oriented to person, place, and time.  Skin: Skin is warm and dry.    Data Reviewed Mammogram reviewed and stable.   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 Stable exam. Patient not on antihormonal treatment due to significant side effects (increased episodes of TIA's).    Plan    Patient to return in 6 months with right diagnostic mammogram.        Athenia Rys G 07/21/2014, 6:02 AM

## 2014-07-20 NOTE — Patient Instructions (Signed)
Patient to return in 6 months with right diagnostic mammogram. Continue self breast exams. Call office for any new breast issues or concerns.

## 2014-07-21 ENCOUNTER — Encounter: Payer: Self-pay | Admitting: General Surgery

## 2014-07-26 ENCOUNTER — Encounter: Payer: Self-pay | Admitting: General Surgery

## 2014-09-09 ENCOUNTER — Ambulatory Visit: Payer: Self-pay | Admitting: Radiation Oncology

## 2014-09-24 ENCOUNTER — Ambulatory Visit: Payer: Self-pay | Admitting: Radiation Oncology

## 2014-12-30 ENCOUNTER — Ambulatory Visit
Admit: 2014-12-30 | Disposition: A | Discharge status: Home / Self Care | Payer: Self-pay | Attending: Obstetrics and Gynecology | Admitting: Obstetrics and Gynecology

## 2014-12-30 LAB — HEMOGLOBIN: HGB: 13.8 g/dL (ref 12.0–16.0)

## 2015-01-03 ENCOUNTER — Ambulatory Visit
Admit: 2015-01-03 | Disposition: A | Discharge status: Home / Self Care | Payer: Self-pay | Attending: Obstetrics and Gynecology | Admitting: Obstetrics and Gynecology

## 2015-01-03 HISTORY — PX: ABLATION: SHX5711

## 2015-01-12 ENCOUNTER — Encounter: Payer: Self-pay | Admitting: General Surgery

## 2015-01-14 NOTE — Op Note (Signed)
PATIENT NAME:  Valerie Mcdaniel, HOUSH MR#:  [id number] DATE OF BIRTH:  1971-07-25  DATE:  06/29/2013  PREOPERATIVE DIAGNOSIS: Ductal carcinoma in situ, right breast.   POSTOPERATIVE DIAGNOSIS: Ductal carcinoma in situ, right breast.   OPERATION: Right breast lumpectomy with ultrasound guidance and localization.   ANESTHESIA: General.   COMPLICATIONS: None.   ESTIMATED BLOOD LOSS: Less than 20 mL.   DRAINS: None.   PROCEDURE: The patient was put to sleep in the supine position on the operating table. The right breast was prepped and draped out as a sterile field. The patient's biopsy site was located in the upper outer quadrant around a 10 to 11 o'clock position. After a time-out procedure was performed, ultrasound probe was brought up to the field and the biopsy cavity was identified at the 10 to 11 o'clock position through a small stab incision. An attempt was made to place an Ultra Wire, however the wire was not easy to negotiate the very thick glandular tissue. Accordingly, this was taken out and an 18-gauge spinal lead was positioned going through the area of biopsy. A curvilinear incision was made along the upper outer quadrant from about the 10 to 12 o'clock position and carefully deepened through the layers of subcutaneous tissue, was elevated on both sides. The biopsy cavity was then identified somewhat a little deeper and after this was identified a complete excision was performed, including the entire biopsy cavity with the margins being tagged afterwards.   The specimen mammogram was obtained showing that the clip was in place in the middle. The initial assessment for margin suggested the lateral margin being somewhat close to the biopsy cavity. The anterior margin was a little bit open, but it was then reapproximated with a tacking stitch for the margin.   Final report will be based on routine histologic evaluation. Grossly, the glandular tissue appeared normal elsewhere. It did not have any  abnormal appearance or feel in the tissue. The glandular elements were approximated with 2-0 Vicryl, and after ensuring hemostasis the wound was irrigated. The subcutaneous tissue was closed with 2-0 Vicryl. The skin closed with subcuticular 4-0 Vicryl. Was covered with Dermabond. The procedure was well-tolerated. She was subsequently extubated and taken to the recovery room in stable condition    ____________________________ S.Robinette Haines, MD sgs:dm D: 06/29/2013 11:02:30 ET T: 06/29/2013 11:12:20 ET JOB#: [id number]  cc: S.G. Jamal Collin, MD, <Dictator> [facility] Robinette Haines MD ELECTRONICALLY SIGNED 07/01/2013 8:03

## 2015-01-14 NOTE — Consult Note (Signed)
Reason for Visit: This 44 year old Female patient presents to the clinic for initial evaluation of  breast cancer .   Referred by Dr. Jamal Collin.  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 She underwent stereotactic biopsy which was positive for ductal carcinoma in situ.she then underwent a wide local excision for a overall 4 mm region of ductal carcinoma in situ overall nuclear grade 2 margin was clear a 9 mm. Tumor was ER/PR positive overall nuclear grade 2 with cribriform features. She tolerated her surgery well. Is rather emotional today although from a clinical standpoint doing well. She seen today to discuss treatment options.  Past Hx:    DCIS: right breast   Migraines:    Tobacco Use:    gerd:    htn:    Right breast biopsy: Sep 2014   Neck Lesion Biopsy:    Cesarean Section:   Past, Family and Social History:  Past Medical History positive   Cardiovascular hypertension   Gastrointestinal GERD   Neurological/Psychiatric migraine   Past Surgical History C-section 9 left neck lesion   Family History positive   Family History Comments father with lung cancer maternal aunt with breast cancer and maternal grandmother with throat cancer   Social History positive   Social History Comments 30-pack-year smoking history continues to smoke no EtOH abuse history   Additional Past Medical and Surgical History accompanied by her  husband today   Allergies:   Tape: Other  NKDA: None  Home Meds:  Home Medications: Medication Instructions Status  Calcium 600+D 1 tab(s) orally once a day Active  multivitamin 1 tab(s) orally once a day Active  lisinopril 10 mg oral tablet 1 tab(s) orally once a day (in the morning) Active  [employer] Acid Reducer 1 tab(s) orally once a day (in the morning) Active  Aspirin 325 mg. 1 tab(s) orally once a day Active   Review of Systems:  General negative   Performance Status (ECOG) 0   Skin negative   Breast see HPI   Ophthalmologic negative   ENMT negative   Respiratory and Thorax negative   Cardiovascular negative   Gastrointestinal negative   Genitourinary negative   Musculoskeletal negative   Neurological negative   Psychiatric negative   Hematology/Lymphatics negative   Endocrine negative   Allergic/Immunologic negative   Nursing Notes:  Nursing Vital Signs and Chemo Nursing Nursing Notes: *CC Vital Signs Flowsheet:   20-Oct-14 08:28  Temp Temperature 98.5  Pulse Pulse 89  Respirations Respirations 20  SBP SBP 117  DBP DBP 80  Pain Scale (0-10)  0  Current Weight (kg) (kg) 77.8  Height (cm) centimeters 163.5  BSA (m2) 1.8   Physical Exam:  General/Skin/HEENT:  General normal   Skin normal   Eyes normal   ENMT normal   Head and Neck normal   Additional PE well-developed female in NAD. Looks older than stated age. Right breast is a wide local excision scar which is healing well. No dominant mass  or nodularity is noted in either breast into position examined. No axillary or supraclavicular adenopathy is identified.   Breasts/Resp/CV/GI/GU:  Respiratory and Thorax normal   Cardiovascular normal   Gastrointestinal normal   Genitourinary normal   MS/Neuro/Psych/Lymph:  Musculoskeletal normal   Neurological normal   Lymphatics normal   Other Results:  Radiology Results: LabUnknown:    05-Aug-14 10:31, Digital Additional  Views Rt Breast (SCR)  PACS Image   [facility]:  Digital Additional Views Rt Breast (SCR)   REASON FOR EXAM:    av rt microcalcifications  COMMENTS:       PROCEDURE: MAM - MAM DIG ADDVIEWS RT SCR  - Apr 28 2013 10:31AM     RESULT: The patient underwent screening mammography on April 23, 2013 at   which time faint microcalcifications were noted in the deep outer aspect   of the right breast. The patient returned today for supplemental   mammographic views. The microcalcifications persist and remain   indeterminate. These are loosely grouped and there are at least 2   groupings adjacent toone another. The microcalcifications have been   marked electronically and the images saved.    IMPRESSION:  There remain indeterminate microcalcifications without   associated soft tissue masses in the deep parenchyma of the right breast     laterally. Surgical consultation is recommended.    BI-RADS 4:  There are findings in the intervention with a low suspicion   for malignancy. Surgical consultation is recommended.        Dictation site:1BREAST COMPOSITION: The breast composition is   HETEROGENEOUSLY DENSE (glandular tissue is 51-75%) This may decrease the   sensitivity of mammography.        Verified By: DAVID A. Martinique, M.D., MD   Relevent Results:   Relevant Scans and Labs mammograms are reviewed   Assessment and Plan: Impression:   ductal carcinoma in situ of the right breast status post wide local excision ER/PR positive and 44 year old female Plan:   I discussed treatment options with the patient today. She is cautioned very for accelerated rest radiation based on the DCIS diagnosis as well as her age. I do believe to be an excellent candidate 04 MammoSite catheter placement accelerated partial breast irradiation. Patient is somewhat concerned about not treating the whole breast and I have described standard whole breast radiation with the patient and her husband also. They're  leaning toward whole breast radiation at this time. I've given him an appointment next week for CT simulation. They will contact me if they decide they want to go to accelerated partial breast irradiation and we will arrange with Dr. Emilee Hero. to have the MammoSite balloon catheter placed. Risks and benefits of treatment including skin reaction, fatigue, possible occlusion of some superficial lung, and alteration of blood counts were all explained in detail to the patient. She seems to comprehend my treatment plan well.she will also be a candidate for tamoxifen therapy after completion of radiation.  I would like to take this opportunity to thank you for allowing me to continue to participate in this patient's care.  CC Referral:  cc: Dr. Jamal Collin, Dr. Benita Stabile   Electronic Signatures: Baruch Gouty, Roda Shutters (MD)  (Signed 20-Oct-14 12:02)  Authored: HPI, Diagnosis, Past Hx, PFSH, Allergies, Home Meds, ROS, Nursing Notes, Physical Exam, Other Results, Relevent Results, Encounter Assessment and Plan, CC Referring Physician   Last Updated: 20-Oct-14 12:02 by Armstead Peaks (MD)

## 2015-01-17 LAB — SURGICAL PATHOLOGY

## 2015-01-23 NOTE — Op Note (Signed)
PATIENT NAME:  Valerie Mcdaniel, KOCHER MR#:  [id number] DATE OF BIRTH:  04/24/1971  DATE OF PROCEDURE:  01/03/2015  PREOPERATIVE DIAGNOSES:  1. Menorrhagia.  2. Dysmenorrhea.   POSTOPERATIVE DIAGNOSES:  1. Menorrhagia.  2. Dysmenorrhea.   OPERATION: Hysteroscopy, dilation and curettage with NovaSure endometrial ablation.   ANESTHESIA: General.   SURGEON: Romuald Mccaslin S. Marcelline Mates, M.D.   ESTIMATED BLOOD LOSS: Minimal.   OPERATIVE FLUIDS: 500 mL.   URINE OUTPUT: 10 mL.   COMPLICATIONS: None.   FINDINGS: There was moderately proliferative endometrium. There were no endometrial masses. The uterine sounding length was 9 cm with the cervical length determined to be 4 cm and cavity width of 2.7 cm.  Adequate charring of uterine lining after ablation procedure.   SPECIMEN: Endometrial curettings.   CONDITION: Stable.   PROCEDURE: The patient was taken to the operating room where she was placed under general anesthesia without difficulty. She was then prepped and draped in normal sterile fashion and placed in the dorsal lithotomy position using Allen stirrups. A straight catheterization was performed. Next, a sterile speculum was inserted into the vagina. A single-tooth tenaculum was used to grasp the anterior lip of the cervix. Dilation was performed. Prior to dilation, the uterus was sounded to 9 cm, and the cervical length was determined to be 4 cm. Next, a 5 mm hysteroscope was then introduced into the uterus under direct visualization. The cavity was allowed to fill and the entire cavity was explored with the findings noted above. The hysteroscope was then removed from the cavity and a sharp curette was then passed into the uterus and endometrial sampling was collected for pathology. Next, the NovaSure device was then inserted into the uterine cavity with the appropriate measurement noted. The cavity width was then determined to be 2.7 cm. The NovaSure device was activated and the ablation was performed,  lasting approximately 1 minute and 50 seconds. After ablation was completed, the NovaSure device was then removed from the uterine cavity. The hysteroscope was then advanced once again into the uterine cavity and adequate charring of the uterine tissue was noted. The hysteroscope was then removed from the uterine cavity. The tenaculum was removed and the speculum was removed from the patient's vagina after excellent hemostasis had been noted.   All instrument and sponge counts were correct x 2 at the end of the procedure.   The patient tolerated the procedure well and was taken to recovery in stable condition.     ____________________________ Chesley Noon Marcelline Mates, MD asc:JT D: 01/03/2015 10:35:37 ET T: 01/03/2015 11:33:17 ET JOB#: [id number]  cc: Chesley Noon. Marcelline Mates, MD, <Dictator> Augusto Gamble MD ELECTRONICALLY SIGNED 01/03/2015 12:30

## 2015-01-24 ENCOUNTER — Ambulatory Visit (INDEPENDENT_AMBULATORY_CARE_PROVIDER_SITE_OTHER): Payer: No Typology Code available for payment source | Admitting: General Surgery

## 2015-01-24 ENCOUNTER — Encounter: Payer: Self-pay | Admitting: General Surgery

## 2015-01-24 VITALS — BP 130/78 | HR 74 | Resp 12 | Ht 65.0 in | Wt 174.0 lb

## 2015-01-24 DIAGNOSIS — D0511 Intraductal carcinoma in situ of right breast: Secondary | ICD-10-CM

## 2015-01-24 NOTE — Patient Instructions (Signed)
Follow up in 1 year for bilateral diagnostic mammogram. Continue monthly self breast checks. Follow up sooner if any changes.

## 2015-01-24 NOTE — Progress Notes (Signed)
Patient ID: Valerie Mcdaniel, female   DOB: [birth year], 44 y.o.   MRN: [id number]  Chief Complaint  Patient presents with  . Follow-up    mammogram    HPI Valerie Mcdaniel is a 44 y.o. female who presents for a breast cancer follow up. The most recent right mammogram was done on 01/11/15.  Patient does perform regular self breast checks and gets regular mammograms done.    HPI  Past Medical History  Diagnosis Date  . GERD (gastroesophageal reflux disease)   . Hypertension   . 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 Cesarean section  March 1997  . Neck lesion biopsy  1989  . Breast surgery Right 06/29/13    lumpectomy    Family History  Problem Relation Age of Onset  . Cancer Father     lung  . Cancer Maternal Aunt     breast  . Cancer Maternal Grandmother     throat    Social History History  Substance Use Topics  . Smoking status: Current Every Day Smoker -- 1.00 packs/day    Types: Cigarettes  . Smokeless tobacco: Never Used  . Alcohol Use: No    No Known Allergies  Current Outpatient Prescriptions  Medication Sig Dispense Refill  . aspirin 325 MG tablet Take 325 mg by mouth daily.    . Calcium Carbonate-Vit D-Min (CALCIUM 1200 PO) Take 1 tablet by mouth daily.    . famotidine (PEPCID) 20 MG tablet Take 20 mg by mouth daily.    Marland Kitchen lisinopril (PRINIVIL,ZESTRIL) 10 MG tablet Take 10 mg by mouth daily.    . Multiple Vitamin (MULTIVITAMIN) capsule Take 1 capsule by mouth daily.     No current facility-administered medications for this visit.    Review of Systems Review of Systems  Constitutional: Negative.   Cardiovascular: Negative.     Blood pressure 130/78, pulse 74, resp. rate 12, height 5\' 5"  (1.651 m), weight 174 lb (78.926 kg), last menstrual period 12/28/2014.  Physical Exam Physical Exam  Constitutional: She is oriented to person, place, and time. She appears well-developed and  well-nourished.  Eyes: Conjunctivae are normal. No scleral icterus.  Neck: Neck supple.  Cardiovascular: Normal rate, regular rhythm and normal heart sounds.   Pulmonary/Chest: Effort normal and breath sounds normal. Right breast exhibits no inverted nipple, no mass, no nipple discharge, no skin change and no tenderness. Left breast exhibits no inverted nipple, no mass, no nipple discharge, no skin change and no tenderness.    Abdominal: Soft. Bowel sounds are normal. There is no tenderness.  Lymphadenopathy:    She has no cervical adenopathy.  Neurological: She is alert and oriented to person, place, and time.  Skin: Skin is warm and dry.    Data Reviewed Mammogram - stable.  Assessment    Physical exam - stable. 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  Pt has had TIAs and is therefore not on antihormonal therapy.    Plan    Follow up in 1 year for bilateral diagnostic mammogram.        PCP:  Trudie Reed 01/25/2015, 5:37 AM

## 2015-01-25 ENCOUNTER — Encounter: Payer: Self-pay | Admitting: General Surgery

## 2015-07-11 ENCOUNTER — Encounter: Payer: Self-pay | Admitting: General Surgery

## 2015-07-14 ENCOUNTER — Encounter: Payer: Self-pay | Admitting: General Surgery

## 2015-07-14 ENCOUNTER — Ambulatory Visit (INDEPENDENT_AMBULATORY_CARE_PROVIDER_SITE_OTHER): Payer: No Typology Code available for payment source | Admitting: General Surgery

## 2015-07-14 VITALS — BP 138/86 | HR 90 | Resp 14 | Ht 65.0 in | Wt 174.0 lb

## 2015-07-14 DIAGNOSIS — D0511 Intraductal carcinoma in situ of right breast: Secondary | ICD-10-CM

## 2015-07-14 NOTE — Patient Instructions (Addendum)
Continue self breast exams. Call office for any new breast issues or concerns. The patient has been asked to return to the office in one year with a bilateral diagnostic mammogram. 

## 2015-07-14 NOTE — Progress Notes (Signed)
Patient ID: Valerie Mcdaniel, female   DOB: 08-01-1971, 44 y.o.   MRN: [id number]  Chief Complaint  Patient presents with  . Follow-up    mammogram    HPI Valerie Mcdaniel is a 44 y.o. female.  who presents for her breast cancer follow up and a breast evaluation. The most recent mammogram was done on 07-08-15.  Patient does perform regular self breast checks and gets regular mammograms done.   No new breast issues. She has noticed occasional twitching in her eye and Dr Hall Busing is aware.  HPI  Past Medical History  Diagnosis Date  . GERD (gastroesophageal reflux disease)   . Hypertension   . Cancer [facility]) 2014    right breast, DCIS, ER/PR positive    Past Surgical History  Procedure Laterality Date  . Cesarean section  March 1997  . Neck lesion biopsy  1989  . Breast surgery Right 06/29/13    lumpectomy  . Ablation  01-03-15    D & C    Family History  Problem Relation Age of Onset  . Cancer Father     lung  . Cancer Maternal Aunt     breast  . Cancer Maternal Grandmother     throat    Social History Social History  Substance Use Topics  . Smoking status: Current Every Day Smoker -- 1.00 packs/day    Types: Cigarettes  . Smokeless tobacco: Never Used  . Alcohol Use: No    No Known Allergies  Current Outpatient Prescriptions  Medication Sig Dispense Refill  . aspirin 325 MG tablet Take 325 mg by mouth daily.    . Calcium Carbonate-Vit D-Min (CALCIUM 1200 PO) Take 1 tablet by mouth daily.    . famotidine (PEPCID) 20 MG tablet Take 20 mg by mouth daily.    Marland Kitchen lisinopril (PRINIVIL,ZESTRIL) 10 MG tablet Take 10 mg by mouth daily.    . Multiple Vitamin (MULTIVITAMIN) capsule Take 1 capsule by mouth daily.     No current facility-administered medications for this visit.    Review of Systems Review of Systems  Constitutional: Negative.   Respiratory: Negative.   Cardiovascular: Negative.     Blood pressure 138/86, pulse 90, resp. rate 14, height 5\' 5"  (1.651 m),  weight 174 lb (78.926 kg).  Physical Exam Physical Exam  Constitutional: She is oriented to person, place, and time. She appears well-developed and well-nourished.  HENT:  Mouth/Throat: Oropharynx is clear and moist.  Eyes: Conjunctivae are normal. No scleral icterus.  Neck: Neck supple.  Cardiovascular: Normal rate, regular rhythm and normal heart sounds.   Pulmonary/Chest: Effort normal and breath sounds normal. Right breast exhibits no inverted nipple, no mass, no nipple discharge, no skin change and no tenderness. Left breast exhibits no inverted nipple, no mass, no nipple discharge, no skin change and no tenderness.  Right breast lumpectomy site well healed.  Abdominal: Soft. Normal appearance. There is no hepatomegaly. There is no tenderness.  Lymphadenopathy:    She has no cervical adenopathy.    She has no axillary adenopathy.  Neurological: She is alert and oriented to person, place, and time.  Skin: Skin is warm and dry.  Psychiatric: Her behavior is normal.    Data Reviewed Mammogram reviewed and stable.  Assessment    Stable physical exam. 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 Pt has had TIAs and is therefore not on antihormonal therapy.       Plan    The patient has been asked to  return to the office in one year with a bilateral diagnostic mammogram.     PCP:  Trudie Reed 07/14/2015, 6:15 PM

## 2015-08-31 ENCOUNTER — Encounter: Payer: Self-pay | Admitting: *Deleted

## 2015-09-16 ENCOUNTER — Ambulatory Visit: Payer: Self-pay | Admitting: Radiation Oncology

## 2015-09-23 ENCOUNTER — Ambulatory Visit
Admission: RE | Admit: 2015-09-23 | Discharge: 2015-09-23 | Disposition: A | Discharge status: Home / Self Care | Payer: No Typology Code available for payment source | Source: Ambulatory Visit | Attending: Radiation Oncology | Admitting: Radiation Oncology

## 2015-09-23 ENCOUNTER — Encounter: Payer: Self-pay | Admitting: Radiation Oncology

## 2015-09-23 ENCOUNTER — Ambulatory Visit: Payer: Self-pay | Admitting: Radiation Oncology

## 2016-02-08 ENCOUNTER — Ambulatory Visit (INDEPENDENT_AMBULATORY_CARE_PROVIDER_SITE_OTHER): Payer: Self-pay | Admitting: General Surgery

## 2016-02-08 ENCOUNTER — Encounter: Payer: Self-pay | Admitting: General Surgery

## 2016-02-08 VITALS — BP 128/72 | HR 80 | Resp 14 | Ht 66.0 in | Wt 173.0 lb

## 2016-02-08 DIAGNOSIS — D0511 Intraductal carcinoma in situ of right breast: Secondary | ICD-10-CM

## 2016-02-08 DIAGNOSIS — R0789 Other chest pain: Secondary | ICD-10-CM

## 2016-02-08 NOTE — Patient Instructions (Signed)
Follow up as scheduled in October

## 2016-02-08 NOTE — Progress Notes (Addendum)
Patient ID: Valerie Mcdaniel, female   DOB: 08-Mar-1971, 45 y.o.   MRN: [id number]  Chief Complaint  Patient presents with  . Other    Left Breast tenderness    HPI Valerie Mcdaniel is a 45 y.o. female here today for an evaluation for left breast tenderness. She first noticed the tenderness on  About 2-3 weeks ago, near beginning of May. Denies any lumps. She feels the left breast is fuller. Denies redness or discharge.  She is due for her yearly mammograms in October 2017.  I have reviewed the history of present illness with the patient.  HPI  Past Medical History  Diagnosis Date  . GERD (gastroesophageal reflux disease)   . Hypertension   . Cancer [facility]) 2014    right breast, DCIS, ER/PR positive    Past Surgical History  Procedure Laterality Date  . Cesarean section  March 1997  . Neck lesion biopsy  1989  . Ablation  01-03-15    D & C  . Breast surgery Right 06/29/13    lumpectomy    Family History  Problem Relation Age of Onset  . Cancer Father     lung  . Cancer Maternal Aunt     breast  . Cancer Maternal Grandmother     throat    Social History Social History  Substance Use Topics  . Smoking status: Current Every Day Smoker -- 1.00 packs/day    Types: Cigarettes  . Smokeless tobacco: Never Used  . Alcohol Use: No    No Known Allergies  Current Outpatient Prescriptions  Medication Sig Dispense Refill  . aspirin 325 MG tablet Take 325 mg by mouth daily.    . Calcium Carbonate-Vit D-Min (CALCIUM 1200 PO) Take 1 tablet by mouth daily.    . famotidine (PEPCID) 20 MG tablet Take 20 mg by mouth daily.    Marland Kitchen lisinopril (PRINIVIL,ZESTRIL) 10 MG tablet Take 10 mg by mouth daily.    . Multiple Vitamin (MULTIVITAMIN) capsule Take 1 capsule by mouth daily.    . RED YEAST RICE EXTRACT PO Take by mouth 2 (two) times daily.     No current facility-administered medications for this visit.    Review of Systems Review of Systems  Constitutional: Negative.   Respiratory:  Negative.   Cardiovascular: Negative.     Blood pressure 128/72, pulse 80, resp. rate 14, height 5\' 6"  (1.676 m), weight 173 lb (78.472 kg).  Physical Exam Physical Exam  Constitutional: She is oriented to person, place, and time. She appears well-developed and well-nourished.  Eyes: Conjunctivae are normal. No scleral icterus.  Neck: Neck supple. No thyromegaly present.  Cardiovascular: Normal rate, regular rhythm and normal heart sounds.   Pulmonary/Chest: Effort normal and breath sounds normal. Right breast exhibits mass (small knot ). Right breast exhibits no inverted nipple, no nipple discharge, no skin change and no tenderness. Left breast exhibits tenderness (non focal). Left breast exhibits no inverted nipple, no mass, no nipple discharge and no skin change.    Lymphadenopathy:    She has no cervical adenopathy.  Neurological: She is alert and oriented to person, place, and time.  Skin: Skin is warm and dry.    Data Reviewed Notes reviewed.  Assessment    Left sided breast pain -no findings of concern DCIS Right breast, s/p lumpectomy and radiation. Pt not on antihormonal therapy due to history of TIAs.     Plan    Follow up as scheduled in October.  This information has been scribed by Verlene Mayer, CMA    PCP: Dr. Johny Blamer 02/08/2016, 1:04 PM

## 2016-02-14 ENCOUNTER — Ambulatory Visit: Payer: No Typology Code available for payment source | Admitting: General Surgery

## 2016-03-23 ENCOUNTER — Emergency Department: Payer: No Typology Code available for payment source

## 2016-03-23 ENCOUNTER — Emergency Department
Admission: EM | Admit: 2016-03-23 | Discharge: 2016-03-23 | Disposition: A | Discharge status: Home / Self Care | Payer: No Typology Code available for payment source | Attending: Emergency Medicine | Admitting: Emergency Medicine

## 2016-03-23 DIAGNOSIS — Z79899 Other long term (current) drug therapy: Secondary | ICD-10-CM | POA: Insufficient documentation

## 2016-03-23 DIAGNOSIS — F17210 Nicotine dependence, cigarettes, uncomplicated: Secondary | ICD-10-CM | POA: Insufficient documentation

## 2016-03-23 DIAGNOSIS — I10 Essential (primary) hypertension: Secondary | ICD-10-CM | POA: Insufficient documentation

## 2016-03-23 DIAGNOSIS — W899XXA Exposure to unspecified man-made visible and ultraviolet light, initial encounter: Secondary | ICD-10-CM | POA: Insufficient documentation

## 2016-03-23 DIAGNOSIS — G43909 Migraine, unspecified, not intractable, without status migrainosus: Secondary | ICD-10-CM | POA: Insufficient documentation

## 2016-03-23 DIAGNOSIS — L568 Other specified acute skin changes due to ultraviolet radiation: Secondary | ICD-10-CM | POA: Insufficient documentation

## 2016-03-23 DIAGNOSIS — Z7982 Long term (current) use of aspirin: Secondary | ICD-10-CM | POA: Insufficient documentation

## 2016-03-23 DIAGNOSIS — G43009 Migraine without aura, not intractable, without status migrainosus: Secondary | ICD-10-CM

## 2016-03-23 DIAGNOSIS — Z8719 Personal history of other diseases of the digestive system: Secondary | ICD-10-CM | POA: Insufficient documentation

## 2016-03-23 DIAGNOSIS — Z853 Personal history of malignant neoplasm of breast: Secondary | ICD-10-CM | POA: Insufficient documentation

## 2016-03-23 MED ORDER — KETOROLAC TROMETHAMINE 30 MG/ML IJ SOLN
30.0000 mg | Freq: Once | INTRAMUSCULAR | Status: AC
  Administered 2016-03-23: 30 mg via INTRAVENOUS
  Filled 2016-03-23: qty 1

## 2016-03-23 MED ORDER — OXYCODONE-ACETAMINOPHEN 5-325 MG PO TABS: 1.0000 | ORAL_TABLET | ORAL | Status: DC | PRN

## 2016-03-23 MED ORDER — MORPHINE SULFATE (PF) 4 MG/ML IV SOLN
4.0000 mg | Freq: Once | INTRAVENOUS | Status: AC
Start: 2016-03-23 — End: 2016-03-23
  Administered 2016-03-23: 4 mg via INTRAVENOUS
  Filled 2016-03-23: qty 1

## 2016-03-23 MED ORDER — ONDANSETRON HCL 4 MG/2ML IJ SOLN
4.0000 mg | Freq: Once | INTRAMUSCULAR | Status: AC
  Administered 2016-03-23: 4 mg via INTRAVENOUS
  Filled 2016-03-23: qty 2

## 2016-03-23 NOTE — Discharge Instructions (Signed)

## 2016-03-23 NOTE — ED Provider Notes (Signed)
[facility] Emergency Department Provider Note  ____________________________________________  Time seen: 2:20 AM  I have reviewed the triage vital signs and the nursing notes.   HISTORY  Chief Complaint Migraine     HPI Valerie Mcdaniel is a 45 y.o. female presents with "migraine headache which started approximately 10:30 last night. Patient states current pain score is 10 out of 10 and located frontal parietal region accompanied by nausea and photosensitivity. Patient states that she took Tylenol without any relief of headache. Patient states that she has a history of migraines which she's been evaluated in the past including MRI which was negative    Past Medical History  Diagnosis Date  . GERD (gastroesophageal reflux disease)   . Hypertension   . Cancer [facility]) 2014    right breast, DCIS, ER/PR positive    Patient Active Problem List   Diagnosis Date Noted  . 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 Cesarean section  March 1997  . Neck lesion biopsy  1989  . Ablation  01-03-15    D & C  . Breast surgery Right 06/29/13    lumpectomy    Current Outpatient Rx  Name  Route  Sig  Dispense  Refill  . aspirin 325 MG tablet   Oral   Take 325 mg by mouth daily.         . Calcium Carbonate-Vit D-Min (CALCIUM 1200 PO)   Oral   Take 1 tablet by mouth daily.         . famotidine (PEPCID) 20 MG tablet   Oral   Take 20 mg by mouth daily.         Marland Kitchen lisinopril (PRINIVIL,ZESTRIL) 10 MG tablet   Oral   Take 10 mg by mouth daily.         . Multiple Vitamin (MULTIVITAMIN) capsule   Oral   Take 1 capsule by mouth daily.         . RED YEAST RICE EXTRACT PO   Oral   Take by mouth 2 (two) times daily.           Allergies No known drug allergies  Family History  Problem Relation Age of Onset  . Cancer Father     lung  . Cancer Maternal Aunt     breast  . Cancer  Maternal Grandmother     throat    Social History Social History  Substance Use Topics  . Smoking status: Current Every Day Smoker -- 1.00 packs/day    Types: Cigarettes  . Smokeless tobacco: Never Used  . Alcohol Use: No    Review of Systems  Constitutional: Negative for fever. Eyes: Negative for visual changes. ENT: Negative for sore throat. Cardiovascular: Negative for chest pain. Respiratory: Negative for shortness of breath. Gastrointestinal: Negative for abdominal pain, vomiting and diarrhea. Genitourinary: Negative for dysuria. Musculoskeletal: Negative for back pain. Skin: Negative for rash. Neurological: Positive for headache and photosensitivity   10-point ROS otherwise negative.  ____________________________________________   PHYSICAL EXAM:  VITAL SIGNS: ED Triage Vitals  Enc Vitals Group     BP 03/23/16 0118 149/91 mmHg     Pulse Rate 03/23/16 0118 81     Resp 03/23/16 0118 18     Temp 03/23/16 0118 98.2 F (36.8 C)     Temp Source 03/23/16 0118 Oral     SpO2 03/23/16 0118 98 %  Weight 03/23/16 0118 170 lb (77.111 kg)     Height 03/23/16 0118 5\' 6"  (1.676 m)     Head Cir --      Peak Flow --      Pain Score 03/23/16 0207 10     Pain Loc --      Pain Edu? --      Excl. in [place]? --      Constitutional: Alert and oriented. Apparent discomfort Eyes: Conjunctivae are normal. PERRL. Normal extraocular movements. ENT   Head: Normocephalic and atraumatic.   Nose: No congestion/rhinnorhea.   Mouth/Throat: Mucous membranes are moist.   Neck: No stridor. Hematological/Lymphatic/Immunilogical: No cervical lymphadenopathy. Cardiovascular: Normal rate, regular rhythm. Normal and symmetric distal pulses are present in all extremities. No murmurs, rubs, or gallops. Respiratory: Normal respiratory effort without tachypnea nor retractions. Breath sounds are clear and equal bilaterally. No wheezes/rales/rhonchi. Gastrointestinal: Soft and  nontender. No distention. There is no CVA tenderness. Genitourinary: deferred Musculoskeletal: Nontender with normal range of motion in all extremities. No joint effusions.  No lower extremity tenderness nor edema. Neurologic:  Normal speech and language. No gross focal neurologic deficits are appreciated. Speech is normal.  Skin:  Skin is warm, dry and intact. No rash noted. Psychiatric: Mood and affect are normal. Speech and behavior are normal. Patient exhibits appropriate insight and judgment.    RADIOLOGY  CT Head Wo Contrast (Final result) Result time: 03/23/16 02:05:23   Final result by Rad Results In Interface (03/23/16 02:05:23)   Narrative:   CLINICAL DATA: Initial evaluation for acute headache. History of migraines.  EXAM: CT HEAD WITHOUT CONTRAST  TECHNIQUE: Contiguous axial images were obtained from the base of the skull through the vertex without intravenous contrast.  COMPARISON: Prior CT from 01/21/2013.  FINDINGS: There is no acute intracranial hemorrhage or infarct. No mass lesion or midline shift. Gray-white matter differentiation is well maintained. Ventricles are normal in size without evidence of hydrocephalus. CSF containing spaces are within normal limits. No extra-axial fluid collection.  The calvarium is intact.  Orbital soft tissues are within normal limits.  The paranasal sinuses and mastoid air cells are well pneumatized and free of fluid.  Scalp soft tissues are unremarkable.  IMPRESSION: Normal head CT. No acute intracranial process identified.   Electronically Signed By: Jeannine Boga M.D. On: 03/23/2016 02:05        Procedures     INITIAL IMPRESSION / ASSESSMENT AND PLAN / ED COURSE  Pertinent labs & imaging results that were available during my care of the patient were reviewed by me and considered in my medical decision making (see chart for details).  Patient received IV morphine 4 mg Toradol 30 mg  and Zofran with resolution of headache and sensitivity. CT scan of the head revealed no gross abnormality  ____________________________________________   FINAL CLINICAL IMPRESSION(S) / ED DIAGNOSES  Final diagnoses:  Migraine without aura and without status migrainosus, not intractable      Gregor Hams, MD 03/23/16 [phone]

## 2016-03-23 NOTE — ED Notes (Signed)
Pt c/o of headache rated at 10 out of 10 on the upper/right portion of head. Pt c/o nausea and light sensitivity.  Pt reports having headache 6/29 at 22:30, taking tylenol PM and going to bed. Pt reports pain woke her up at 00:30 this morning.  Pt reports hx of migraines.

## 2016-03-23 NOTE — ED Notes (Signed)
Pt in with co migraine that started tonight hx of migraines but states worse one ever. States is light sensitive and has taken otc meds without relief.

## 2016-03-23 NOTE — ED Notes (Signed)
MD Brown at bedside.

## 2016-03-23 NOTE — ED Notes (Signed)
Reviewed d/c instructions, follow-up care, prescriptions with pt. Pt verbalized understanding.

## 2016-05-31 ENCOUNTER — Other Ambulatory Visit: Payer: Self-pay | Admitting: General Surgery

## 2016-05-31 DIAGNOSIS — D0511 Intraductal carcinoma in situ of right breast: Secondary | ICD-10-CM

## 2016-06-15 ENCOUNTER — Other Ambulatory Visit: Payer: Self-pay | Admitting: *Deleted

## 2016-06-15 ENCOUNTER — Inpatient Hospital Stay
Admission: RE | Admit: 2016-06-15 | Discharge: 2016-06-15 | Disposition: A | Discharge status: Home / Self Care | Payer: Self-pay | Source: Ambulatory Visit | Attending: *Deleted | Admitting: *Deleted

## 2016-06-15 DIAGNOSIS — Z9289 Personal history of other medical treatment: Secondary | ICD-10-CM

## 2016-06-18 ENCOUNTER — Encounter: Payer: Self-pay | Admitting: *Deleted

## 2016-06-19 ENCOUNTER — Encounter: Payer: Self-pay | Admitting: *Deleted

## 2016-07-13 ENCOUNTER — Ambulatory Visit
Admission: RE | Admit: 2016-07-13 | Discharge: 2016-07-13 | Disposition: A | Discharge status: Home / Self Care | Payer: Medicaid Other | Source: Ambulatory Visit | Attending: General Surgery | Admitting: General Surgery

## 2016-07-13 ENCOUNTER — Other Ambulatory Visit: Payer: Self-pay | Admitting: General Surgery

## 2016-07-13 DIAGNOSIS — D0511 Intraductal carcinoma in situ of right breast: Secondary | ICD-10-CM | POA: Insufficient documentation

## 2016-07-24 ENCOUNTER — Encounter: Payer: Self-pay | Admitting: General Surgery

## 2016-07-24 ENCOUNTER — Ambulatory Visit (INDEPENDENT_AMBULATORY_CARE_PROVIDER_SITE_OTHER): Payer: Self-pay | Admitting: General Surgery

## 2016-07-24 VITALS — BP 126/74 | HR 72 | Resp 12 | Ht 65.0 in | Wt 177.0 lb

## 2016-07-24 DIAGNOSIS — D0511 Intraductal carcinoma in situ of right breast: Secondary | ICD-10-CM

## 2016-07-24 NOTE — Progress Notes (Signed)
Patient ID: Valerie Mcdaniel, female   DOB: 05-Aug-1971, 45 y.o.   MRN: [id number]  Chief Complaint  Patient presents with  . Follow-up    mammogram    HPI Valerie Mcdaniel is a 45 y.o. female who presents for breast evaluation, as well as follow up right DCIS breast cancer (ER/PR positive), and left breast tenderness. The most recent mammogram was done on 07-13-16 which showed no evidence for malignancy. Patient does perform regular self breast checks and gets regular mammograms done.    She is here today with her daughter in law, [name].  I have reviewed the history of present illness with the patient.  HPI  Past Medical History:  Diagnosis Date  . Cancer [facility]) 2014   right breast, DCIS, ER/PR positive  . GERD (gastroesophageal reflux disease)   . Hypertension   . TIA (transient ischemic attack)     Past Surgical History:  Procedure Laterality Date  . ABLATION  01-03-15   D & C  . BREAST SURGERY Right 06/29/13   lumpectomy  . CESAREAN SECTION  March 1997  . NECK LESION BIOPSY  1989    Family History  Problem Relation Age of Onset  . Cancer Father     lung  . Cancer Maternal Aunt     breast  . Cancer Maternal Grandmother     throat    Social History Social History  Substance Use Topics  . Smoking status: Current Every Day Smoker    Packs/day: 1.00    Types: Cigarettes  . Smokeless tobacco: Never Used  . Alcohol use No    No Known Allergies  Current Outpatient Prescriptions  Medication Sig Dispense Refill  . aspirin 325 MG tablet Take 325 mg by mouth daily.    . Calcium Carbonate-Vit D-Min (CALCIUM 1200 PO) Take 1 tablet by mouth daily.    . famotidine (PEPCID) 20 MG tablet Take 20 mg by mouth daily.    Marland Kitchen lisinopril (PRINIVIL,ZESTRIL) 10 MG tablet Take 10 mg by mouth daily.    . Multiple Vitamin (MULTIVITAMIN) capsule Take 1 capsule by mouth daily.    . RED YEAST RICE EXTRACT PO Take by mouth 2 (two) times daily.     No current facility-administered  medications for this visit.     Review of Systems Review of Systems  Constitutional: Negative.   Respiratory: Negative.   Cardiovascular: Negative.     Blood pressure 126/74, pulse 72, resp. rate 12, height 5\' 5"  (1.651 m), weight 177 lb (80.3 kg).  Physical Exam Physical Exam  Constitutional: She is oriented to person, place, and time. She appears well-developed and well-nourished.  HENT:  Mouth/Throat: Oropharynx is clear and moist.  Eyes: Conjunctivae are normal. No scleral icterus.  Neck: Neck supple.  Cardiovascular: Normal rate, regular rhythm and normal heart sounds.   Pulmonary/Chest: Effort normal and breath sounds normal. Right breast exhibits no inverted nipple, no mass, no nipple discharge, no skin change and no tenderness. Left breast exhibits no inverted nipple, no mass, no nipple discharge, no skin change and no tenderness.  Abdominal: Soft. Normal appearance. There is no hepatomegaly. There is no tenderness.  Lymphadenopathy:    She has no cervical adenopathy.    She has no axillary adenopathy.  Neurological: She is alert and oriented to person, place, and time.  Skin: Skin is warm and dry.  Psychiatric: Her behavior is normal.    Data Reviewed Mammogram reviewed and stable.  Assessment    Stable physical  exam. DCIS right breast, s/p lumpectomy and radiation in 2014.  Pt not on antihormonal therapy due to history of TIAs.     Plan    The patient has been asked to return to the office in one year with a bilateral diagnostic mammogram.   Information has been scribed by Karie Fetch RN, BSN,BC.   Aviva Wolfer G 07/24/2016, 3:25 PM

## 2016-07-24 NOTE — Patient Instructions (Addendum)
The patient is aware to call back for any questions or concerns. The patient has been asked to return to the office in one year with a bilateral diagnostic mammogram. 

## 2016-07-25 ENCOUNTER — Ambulatory Visit: Payer: Medicaid Other | Admitting: General Surgery

## 2016-10-22 DIAGNOSIS — M25569 Pain in unspecified knee: Secondary | ICD-10-CM | POA: Insufficient documentation

## 2017-05-24 ENCOUNTER — Other Ambulatory Visit: Payer: Self-pay

## 2017-05-24 DIAGNOSIS — D0511 Intraductal carcinoma in situ of right breast: Secondary | ICD-10-CM

## 2017-06-19 IMAGING — CT CT HEAD W/O CM
3 of 4 series · 15 of 47 positions shown, 18 images · non-contrast
Comparison: Prior CT from 01/21/2013.

CLINICAL DATA: Initial evaluation for acute headache. History of
migraines.

EXAM:
CT HEAD WITHOUT CONTRAST
TECHNIQUE: Contiguous axial images were obtained from the base of the skull
through the vertex without intravenous contrast.

[Series 2: head wo · axial · 0.41mm/px · z∈[-6,+114]mm · 9 of 30 slices shown, 12 images]
[im 3/30  brain]
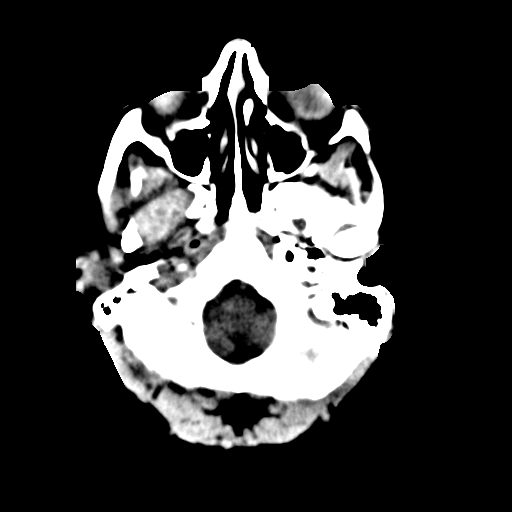
[im 3/30  bone]
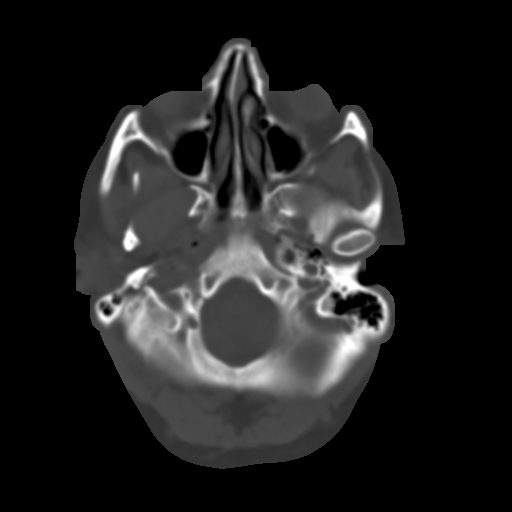
[im 7/30  brain]
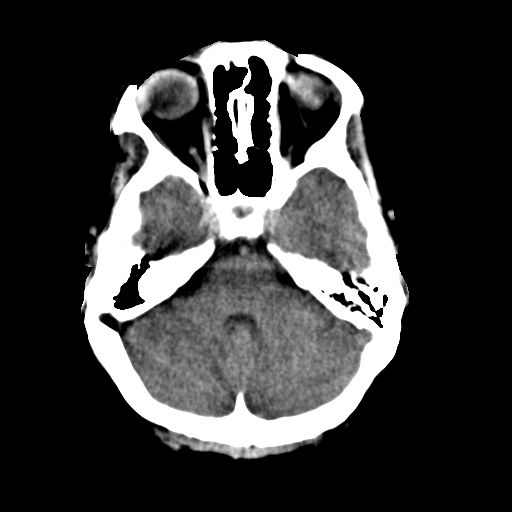
[im 9/30  brain]
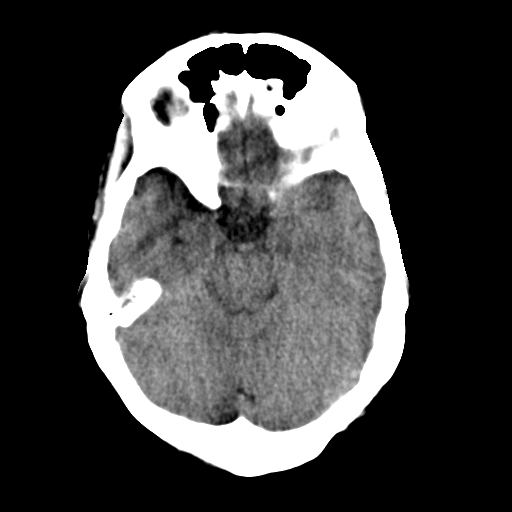
[im 13/30  brain]
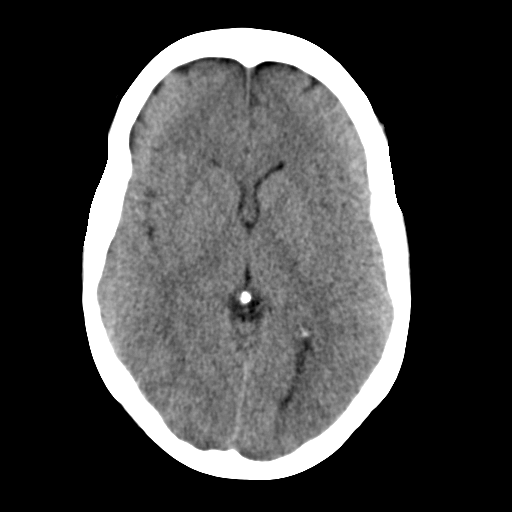
[im 15/30  brain]
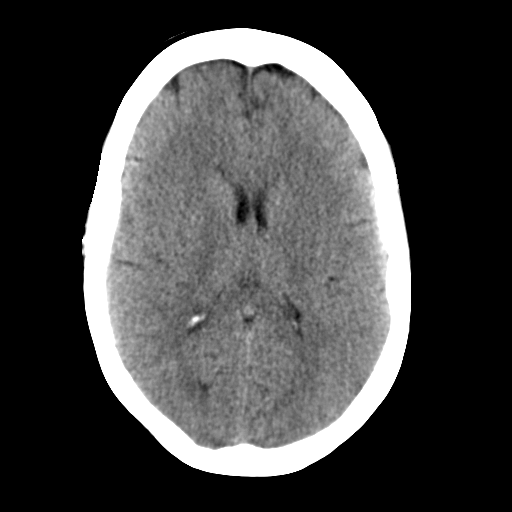
[im 15/30  bone]
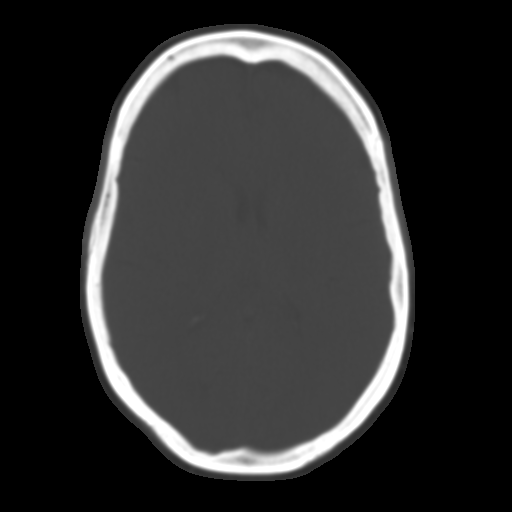
[im 17/30  brain]
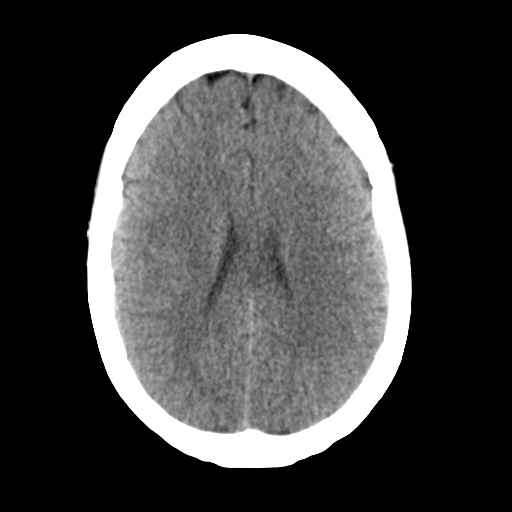
[im 21/30  brain]
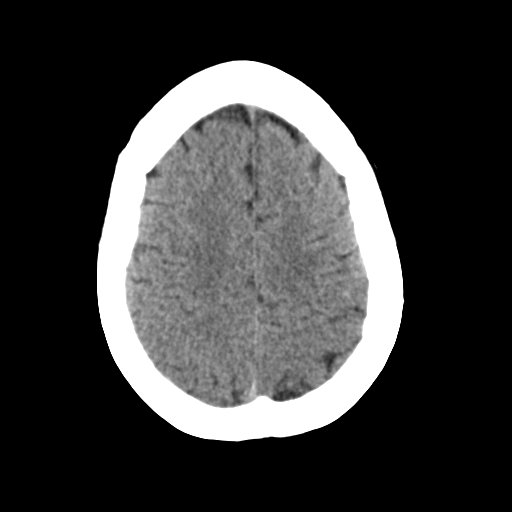
[im 23/30  brain]
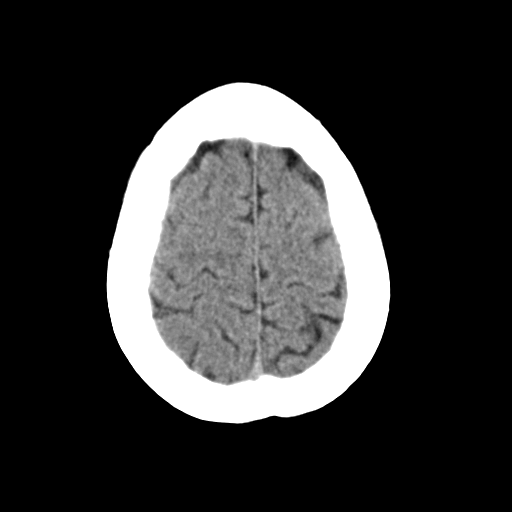
[im 27/30  brain]
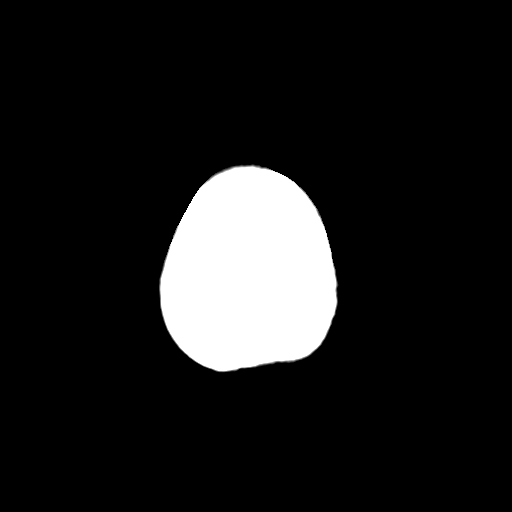
[im 27/30  bone]
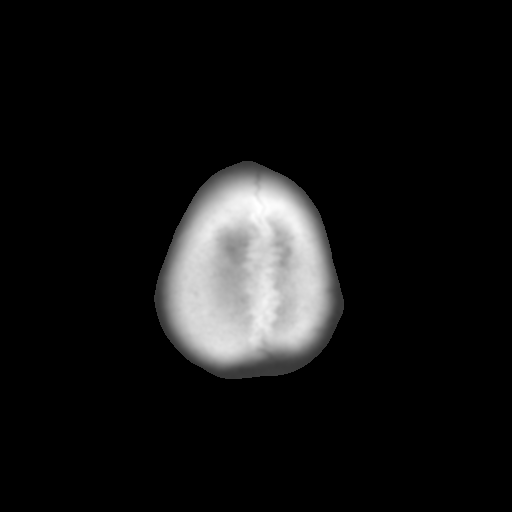

[Series 4: coronal soft · coronal · 0.31mm/px · 3 of 64 slices shown]
[im 22/64  brain]
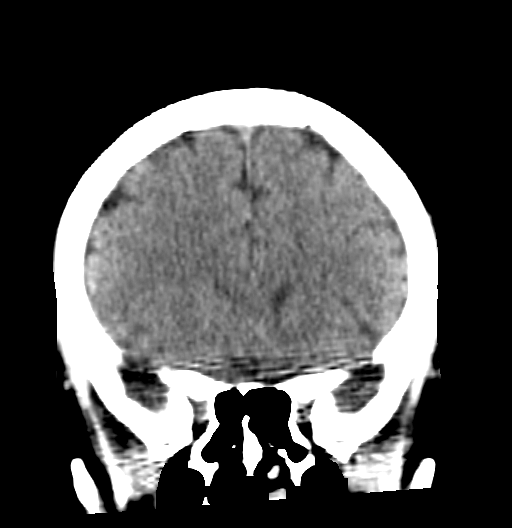
[im 29/64  brain]
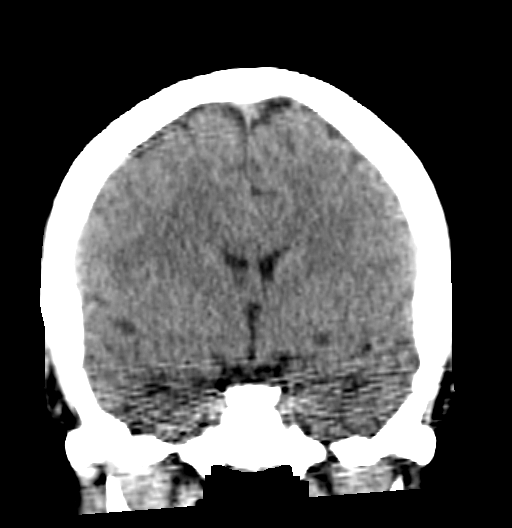
[im 36/64  brain]
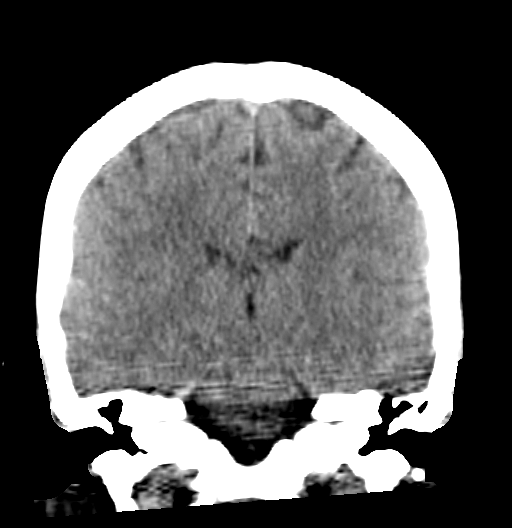

[Series 5: sagittal soft · sagittal · 0.29mm/px · 3 of 47 slices shown]
[im 16/47  brain]
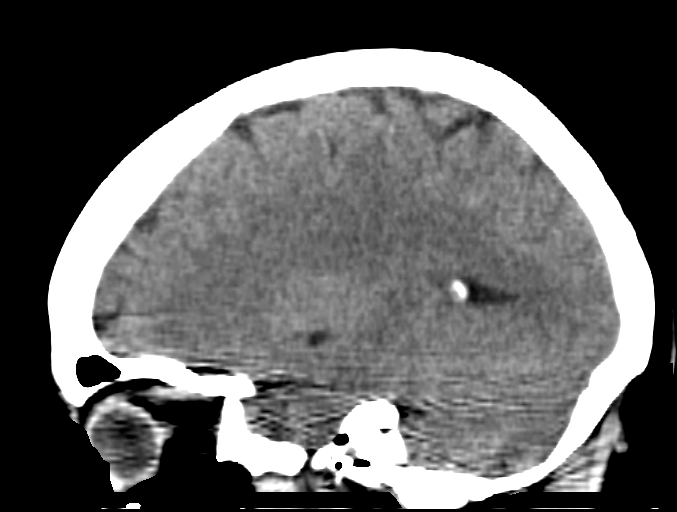
[im 24/47  brain]
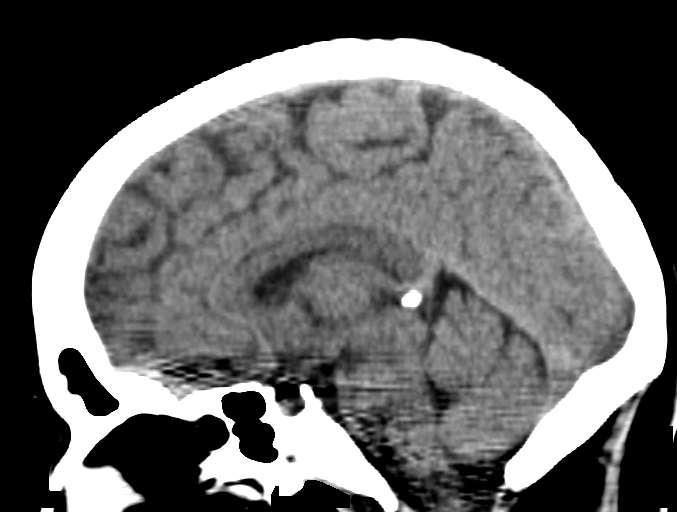
[im 31/47  brain]
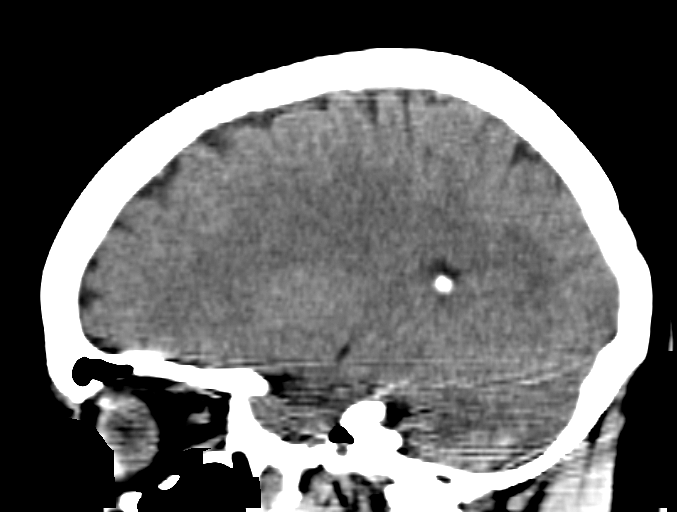

[15 of 47 positions shown; findings below may reference images not displayed]

FINDINGS: There is no acute intracranial hemorrhage or infarct. No mass lesion
or midline shift. Gray-white matter differentiation is well
maintained. Ventricles are normal in size without evidence of
hydrocephalus. CSF containing spaces are within normal limits. No
extra-axial fluid collection.

The calvarium is intact.

Orbital soft tissues are within normal limits.

The paranasal sinuses and mastoid air cells are well pneumatized and
free of fluid.

Scalp soft tissues are unremarkable.
IMPRESSION: Normal head CT.  No acute intracranial process identified.

## 2017-07-18 ENCOUNTER — Ambulatory Visit
Admission: RE | Admit: 2017-07-18 | Discharge: 2017-07-18 | Disposition: A | Discharge status: Home / Self Care | Payer: BLUE CROSS/BLUE SHIELD | Source: Ambulatory Visit | Attending: General Surgery | Admitting: General Surgery

## 2017-07-18 DIAGNOSIS — D0511 Intraductal carcinoma in situ of right breast: Secondary | ICD-10-CM | POA: Insufficient documentation

## 2017-07-18 DIAGNOSIS — Z9889 Other specified postprocedural states: Secondary | ICD-10-CM | POA: Diagnosis not present

## 2017-07-18 HISTORY — DX: Personal history of irradiation: Z92.3

## 2017-07-25 ENCOUNTER — Encounter: Payer: Self-pay | Admitting: General Surgery

## 2017-07-25 ENCOUNTER — Ambulatory Visit (INDEPENDENT_AMBULATORY_CARE_PROVIDER_SITE_OTHER): Payer: BLUE CROSS/BLUE SHIELD | Admitting: General Surgery

## 2017-07-25 VITALS — BP 126/62 | HR 98 | Resp 14 | Ht 66.0 in | Wt 176.0 lb

## 2017-07-25 DIAGNOSIS — D0511 Intraductal carcinoma in situ of right breast: Secondary | ICD-10-CM | POA: Diagnosis not present

## 2017-07-25 NOTE — Progress Notes (Signed)
Patient ID: Valerie Mcdaniel, female   DOB: 21-May-1971, 46 y.o.   MRN: [id number]  Chief Complaint  Patient presents with  . Follow-up    HPI Valerie Mcdaniel is a 46 y.o. female who presents for right DCIS breast cancer (ER/PR positive) follow up. The most recent mammogram was done on 07/18/2017. Pt has history of TIA and takes aspirin daily. S/p lumpectomy and radiation 2014. Positive family history of breast cancer.  Patient does perform regular self breast checks and gets regular mammograms done.   Marland KitchenHPI  Past Medical History:  Diagnosis Date  . Cancer [facility]) 2014   right breast, DCIS, ER/PR positive  . GERD (gastroesophageal reflux disease)   . Hypertension   . Personal history of radiation therapy   . TIA (transient ischemic attack)     Past Surgical History:  Procedure Laterality Date  . ABLATION  01-03-15   D & C  . BREAST BIOPSY Right 2014   +  . BREAST SURGERY Right 06/29/13   lumpectomy  . CESAREAN SECTION  March 1997  . NECK LESION BIOPSY  1989    Family History  Problem Relation Age of Onset  . Cancer Father        lung  . Cancer Maternal Aunt        breast  . Breast cancer Maternal Aunt   . Cancer Maternal Grandmother        throat    Social History Social History  Substance Use Topics  . Smoking status: Current Every Day Smoker    Packs/day: 1.00    Types: Cigarettes  . Smokeless tobacco: Never Used  . Alcohol use No    No Known Allergies  Current Outpatient Prescriptions  Medication Sig Dispense Refill  . aspirin 325 MG tablet Take 325 mg by mouth daily.    . Calcium Carbonate-Vit D-Min (CALCIUM 1200 PO) Take 1 tablet by mouth daily.    Marland Kitchen lisinopril (PRINIVIL,ZESTRIL) 10 MG tablet Take 10 mg by mouth daily.    . Multiple Vitamin (MULTIVITAMIN) capsule Take 1 capsule by mouth daily.     No current facility-administered medications for this visit.     Review of Systems Review of Systems  Constitutional: Negative.   Respiratory: Negative.    Cardiovascular: Negative.     Blood pressure 126/62, pulse 98, resp. rate 14, height 5\' 6"  (1.676 m), weight 176 lb (79.8 kg).  Physical Exam Physical Exam  Constitutional: She is oriented to person, place, and time. She appears well-developed and well-nourished.  Eyes: Conjunctivae are normal. No scleral icterus.  Neck: Neck supple.  Cardiovascular: Normal rate, regular rhythm and normal heart sounds.   Pulmonary/Chest: Effort normal and breath sounds normal. Right breast exhibits no inverted nipple, no mass, no nipple discharge, no skin change and no tenderness. Left breast exhibits no inverted nipple, no mass, no nipple discharge, no skin change and no tenderness.    Lymphadenopathy:    She has no cervical adenopathy.    She has no axillary adenopathy.  Neurological: She is alert and oriented to person, place, and time.  Skin: Skin is warm and dry.    Data Reviewed  Prior notes and mammogram reviewed  Mammogram revealed stable post lumpectomy changes of the right breast. No areas suggestive of malignancy.   Assessment    DCIS right breast, ER/PR positive, s/p lumpectomy and radiation in 2014. Positive FHx of breast cancer. Not on antihormonal therapy due to history of TIA's. Mammogram and physical exam stable.  Plan    The patient has been asked to return to the office in one year with a bilateral diagnostic mammogram with Dr. Bary Castilla.The patient is aware to call back for any questions or concerns.     HPI, Physical Exam, Assessment and Plan have been scribed under the direction and in the presence of Mckinley Jewel, MD  Gaspar Cola, CMA   I have completed the exam and reviewed the above documentation for accuracy and completeness.  I agree with the above.  Haematologist has been used and any errors in dictation or transcription are unintentional.  Seeplaputhur G. Jamal Collin, M.D., F.A.C.S.  Junie Panning G 07/26/2017, 10:33 AM

## 2017-07-25 NOTE — Patient Instructions (Addendum)
  The patient has been asked to return to the office in one year with a bilateral diagnostic mammogram with Dr. Bary Castilla.The patient is aware to call back for any questions or concerns.

## 2018-04-09 ENCOUNTER — Other Ambulatory Visit (HOSPITAL_COMMUNITY)
Admission: RE | Admit: 2018-04-09 | Discharge: 2018-04-09 | Disposition: A | Discharge status: Home / Self Care | Payer: BLUE CROSS/BLUE SHIELD | Source: Ambulatory Visit | Attending: Obstetrics and Gynecology | Admitting: Obstetrics and Gynecology

## 2018-04-09 ENCOUNTER — Encounter: Payer: Self-pay | Admitting: Obstetrics and Gynecology

## 2018-04-09 ENCOUNTER — Ambulatory Visit (INDEPENDENT_AMBULATORY_CARE_PROVIDER_SITE_OTHER): Payer: BLUE CROSS/BLUE SHIELD | Admitting: Obstetrics and Gynecology

## 2018-04-09 VITALS — BP 125/87 | HR 96 | Ht 66.0 in | Wt 179.9 lb

## 2018-04-09 DIAGNOSIS — N951 Menopausal and female climacteric states: Secondary | ICD-10-CM

## 2018-04-09 DIAGNOSIS — E785 Hyperlipidemia, unspecified: Secondary | ICD-10-CM

## 2018-04-09 DIAGNOSIS — E663 Overweight: Secondary | ICD-10-CM

## 2018-04-09 DIAGNOSIS — Z1329 Encounter for screening for other suspected endocrine disorder: Secondary | ICD-10-CM | POA: Diagnosis not present

## 2018-04-09 DIAGNOSIS — Z01419 Encounter for gynecological examination (general) (routine) without abnormal findings: Secondary | ICD-10-CM | POA: Insufficient documentation

## 2018-04-09 DIAGNOSIS — Z853 Personal history of malignant neoplasm of breast: Secondary | ICD-10-CM

## 2018-04-09 DIAGNOSIS — Z13220 Encounter for screening for lipoid disorders: Secondary | ICD-10-CM

## 2018-04-09 MED ORDER — VENLAFAXINE HCL ER 75 MG PO CP24: 75.0000 mg | ORAL_CAPSULE | Freq: Every day | ORAL | 3 refills | Status: DC

## 2018-04-09 NOTE — Patient Instructions (Addendum)
Health Maintenance, Female Adopting a healthy lifestyle and getting preventive care can go a long way to promote health and wellness. Talk with your health care provider about what schedule of regular examinations is right for you. This is a good chance for you to check in with your provider about disease prevention and staying healthy. In between checkups, there are plenty of things you can do on your own. Experts have done a lot of research about which lifestyle changes and preventive measures are most likely to keep you healthy. Ask your health care provider for more information. Weight and diet Eat a healthy diet  Be sure to include plenty of vegetables, fruits, low-fat dairy products, and lean protein.  Do not eat a lot of foods high in solid fats, added sugars, or salt.  Get regular exercise. This is one of the most important things you can do for your health. ? Most adults should exercise for at least 150 minutes each week. The exercise should increase your heart rate and make you sweat (moderate-intensity exercise). ? Most adults should also do strengthening exercises at least twice a week. This is in addition to the moderate-intensity exercise.  Maintain a healthy weight  Body mass index (BMI) is a measurement that can be used to identify possible weight problems. It estimates body fat based on height and weight. Your health care provider can help determine your BMI and help you achieve or maintain a healthy weight.  For females 20 years of age and older: ? A BMI below 18.5 is considered underweight. ? A BMI of 18.5 to 24.9 is normal. ? A BMI of 25 to 29.9 is considered overweight. ? A BMI of 30 and above is considered obese.  Watch levels of cholesterol and blood lipids  You should start having your blood tested for lipids and cholesterol at 48 years of age, then have this test every 5 years.  You may need to have your cholesterol levels checked more often if: ? Your lipid or  cholesterol levels are high. ? You are older than 48 years of age. ? You are at high risk for heart disease.  Cancer screening Lung Cancer  Lung cancer screening is recommended for adults 55-80 years old who are at high risk for lung cancer because of a history of smoking.  A yearly low-dose CT scan of the lungs is recommended for people who: ? Currently smoke. ? Have quit within the past 15 years. ? Have at least a 30-pack-year history of smoking. A pack year is smoking an average of one pack of cigarettes a day for 1 year.  Yearly screening should continue until it has been 15 years since you quit.  Yearly screening should stop if you develop a health problem that would prevent you from having lung cancer treatment.  Breast Cancer  Practice breast self-awareness. This means understanding how your breasts normally appear and feel.  It also means doing regular breast self-exams. Let your health care provider know about any changes, no matter how small.  If you are in your 20s or 30s, you should have a clinical breast exam (CBE) by a health care provider every 1-3 years as part of a regular health exam.  If you are 40 or older, have a CBE every year. Also consider having a breast X-ray (mammogram) every year.  If you have a family history of breast cancer, talk to your health care provider about genetic screening.  If you are at high risk   for breast cancer, talk to your health care provider about having an MRI and a mammogram every year.  Breast cancer gene (BRCA) assessment is recommended for women who have family members with BRCA-related cancers. BRCA-related cancers include: ? Breast. ? Ovarian. ? Tubal. ? Peritoneal cancers.  Results of the assessment will determine the need for genetic counseling and BRCA1 and BRCA2 testing.  Cervical Cancer Your health care provider may recommend that you be screened regularly for cancer of the pelvic organs (ovaries, uterus, and  vagina). This screening involves a pelvic examination, including checking for microscopic changes to the surface of your cervix (Pap test). You may be encouraged to have this screening done every 3 years, beginning at age 22.  For women ages 56-65, health care providers may recommend pelvic exams and Pap testing every 3 years, or they may recommend the Pap and pelvic exam, combined with testing for human papilloma virus (HPV), every 5 years. Some types of HPV increase your risk of cervical cancer. Testing for HPV may also be done on women of any age with unclear Pap test results.  Other health care providers may not recommend any screening for nonpregnant women who are considered low risk for pelvic cancer and who do not have symptoms. Ask your health care provider if a screening pelvic exam is right for you.  If you have had past treatment for cervical cancer or a condition that could lead to cancer, you need Pap tests and screening for cancer for at least 20 years after your treatment. If Pap tests have been discontinued, your risk factors (such as having a new sexual partner) need to be reassessed to determine if screening should resume. Some women have medical problems that increase the chance of getting cervical cancer. In these cases, your health care provider may recommend more frequent screening and Pap tests.  Colorectal Cancer  This type of cancer can be detected and often prevented.  Routine colorectal cancer screening usually begins at 48 years of age and continues through 48 years of age.  Your health care provider may recommend screening at an earlier age if you have risk factors for colon cancer.  Your health care provider may also recommend using home test kits to check for hidden blood in the stool.  A small camera at the end of a tube can be used to examine your colon directly (sigmoidoscopy or colonoscopy). This is done to check for the earliest forms of colorectal  cancer.  Routine screening usually begins at age 33.  Direct examination of the colon should be repeated every 5-10 years through 48 years of age. However, you may need to be screened more often if early forms of precancerous polyps or small growths are found.  Skin Cancer  Check your skin from head to toe regularly.  Tell your health care provider about any new moles or changes in moles, especially if there is a change in a mole's shape or color.  Also tell your health care provider if you have a mole that is larger than the size of a pencil eraser.  Always use sunscreen. Apply sunscreen liberally and repeatedly throughout the day.  Protect yourself by wearing long sleeves, pants, a wide-brimmed hat, and sunglasses whenever you are outside.  Heart disease, diabetes, and high blood pressure  High blood pressure causes heart disease and increases the risk of stroke. High blood pressure is more likely to develop in: ? People who have blood pressure in the high end of  the normal range (130-139/85-89 mm Hg). ? People who are overweight or obese. ? People who are African American.  If you are 21-29 years of age, have your blood pressure checked every 3-5 years. If you are 3 years of age or older, have your blood pressure checked every year. You should have your blood pressure measured twice-once when you are at a hospital or clinic, and once when you are not at a hospital or clinic. Record the average of the two measurements. To check your blood pressure when you are not at a hospital or clinic, you can use: ? An automated blood pressure machine at a pharmacy. ? A home blood pressure monitor.  If you are between 17 years and 37 years old, ask your health care provider if you should take aspirin to prevent strokes.  Have regular diabetes screenings. This involves taking a blood sample to check your fasting blood sugar level. ? If you are at a normal weight and have a low risk for diabetes,  have this test once every three years after 48 years of age. ? If you are overweight and have a high risk for diabetes, consider being tested at a younger age or more often. Preventing infection Hepatitis B  If you have a higher risk for hepatitis B, you should be screened for this virus. You are considered at high risk for hepatitis B if: ? You were born in a country where hepatitis B is common. Ask your health care provider which countries are considered high risk. ? Your parents were born in a high-risk country, and you have not been immunized against hepatitis B (hepatitis B vaccine). ? You have HIV or AIDS. ? You use needles to inject street drugs. ? You live with someone who has hepatitis B. ? You have had sex with someone who has hepatitis B. ? You get hemodialysis treatment. ? You take certain medicines for conditions, including cancer, organ transplantation, and autoimmune conditions.  Hepatitis C  Blood testing is recommended for: ? Everyone born from [birth year] through 1966. ? Anyone with known risk factors for hepatitis C.  Sexually transmitted infections (STIs)  You should be screened for sexually transmitted infections (STIs) including gonorrhea and chlamydia if: ? You are sexually active and are younger than 48 years of age. ? You are older than 48 years of age and your health care provider tells you that you are at risk for this type of infection. ? Your sexual activity has changed since you were last screened and you are at an increased risk for chlamydia or gonorrhea. Ask your health care provider if you are at risk.  If you do not have HIV, but are at risk, it may be recommended that you take a prescription medicine daily to prevent HIV infection. This is called pre-exposure prophylaxis (PrEP). You are considered at risk if: ? You are sexually active and do not regularly use condoms or know the HIV status of your partner(s). ? You take drugs by injection. ? You are  sexually active with a partner who has HIV.  Talk with your health care provider about whether you are at high risk of being infected with HIV. If you choose to begin PrEP, you should first be tested for HIV. You should then be tested every 3 months for as long as you are taking PrEP. Pregnancy  If you are premenopausal and you may become pregnant, ask your health care provider about preconception counseling.  If you may become  pregnant, take 400 to 800 micrograms (mcg) of folic acid every day.  If you want to prevent pregnancy, talk to your health care provider about birth control (contraception). Osteoporosis and menopause  Osteoporosis is a disease in which the bones lose minerals and strength with aging. This can result in serious bone fractures. Your risk for osteoporosis can be identified using a bone density scan.  If you are 65 years of age or older, or if you are at risk for osteoporosis and fractures, ask your health care provider if you should be screened.  Ask your health care provider whether you should take a calcium or vitamin D supplement to lower your risk for osteoporosis.  Menopause may have certain physical symptoms and risks.  Hormone replacement therapy may reduce some of these symptoms and risks. Talk to your health care provider about whether hormone replacement therapy is right for you. Follow these instructions at home:  Schedule regular health, dental, and eye exams.  Stay current with your immunizations.  Do not use any tobacco products including cigarettes, chewing tobacco, or electronic cigarettes.  If you are pregnant, do not drink alcohol.  If you are breastfeeding, limit how much and how often you drink alcohol.  Limit alcohol intake to no more than 1 drink per day for nonpregnant women. One drink equals 12 ounces of beer, 5 ounces of wine, or 1 ounces of hard liquor.  Do not use street drugs.  Do not share needles.  Ask your health care  provider for help if you need support or information about quitting drugs.  Tell your health care provider if you often feel depressed.  Tell your health care provider if you have ever been abused or do not feel safe at home. This information is not intended to replace advice given to you by your health care provider. Make sure you discuss any questions you have with your health care provider. Document Released: 03/26/2011 Document Revised: 02/16/2016 Document Reviewed: 06/14/2015 [employer] Interactive Patient Education  2018 [employer]    Perimenopause Perimenopause is the time when your body begins to move into the menopause (no menstrual period for 12 straight months). It is a natural process. Perimenopause can begin 2-8 years before the menopause and usually lasts for 1 year after the menopause. During this time, your ovaries may or may not produce an egg. The ovaries vary in their production of estrogen and progesterone hormones each month. This can cause irregular menstrual periods, difficulty getting pregnant, vaginal bleeding between periods, and uncomfortable symptoms. What are the causes?  Irregular production of the ovarian hormones, estrogen and progesterone, and not ovulating every month. Other causes include:  Tumor of the pituitary gland in the brain.  Medical disease that affects the ovaries.  Radiation treatment.  Chemotherapy.  Unknown causes.  Heavy smoking and excessive alcohol intake can bring on perimenopause sooner.  What are the signs or symptoms?  Hot flashes.  Night sweats.  Irregular menstrual periods.  Decreased sex drive.  Vaginal dryness.  Headaches.  Mood swings.  Depression.  Memory problems.  Irritability.  Tiredness.  Weight gain.  Trouble getting pregnant.  The beginning of losing bone cells (osteoporosis).  The beginning of hardening of the arteries (atherosclerosis). How is this diagnosed? Your health care provider  will make a diagnosis by analyzing your age, menstrual history, and symptoms. He or she will do a physical exam and note any changes in your body, especially your female organs. Female hormone tests may or may   not be helpful depending on the amount of female hormones you produce and when you produce them. However, other hormone tests may be helpful to rule out other problems. How is this treated? In some cases, no treatment is needed. The decision on whether treatment is necessary during the perimenopause should be made by you and your health care provider based on how the symptoms are affecting you and your lifestyle. Various treatments are available, such as:  Treating individual symptoms with a specific medicine for that symptom.  Herbal medicines that can help specific symptoms.  Counseling.  Group therapy.  Follow these instructions at home:  Keep track of your menstrual periods (when they occur, how heavy they are, how long between periods, and how long they last) as well as your symptoms and when they started.  Only take over-the-counter or prescription medicines as directed by your health care provider.  Sleep and rest.  Exercise.  Eat a diet that contains calcium (good for your bones) and soy (acts like the estrogen hormone).  Do not smoke.  Avoid alcoholic beverages.  Take vitamin supplements as recommended by your health care provider. Taking vitamin E may help in certain cases.  Take calcium and vitamin D supplements to help prevent bone loss.  Group therapy is sometimes helpful.  Acupuncture may help in some cases. Contact a health care provider if:  You have questions about any symptoms you are having.  You need a referral to a specialist (gynecologist, psychiatrist, or psychologist). Get help right away if:  You have vaginal bleeding.  Your period lasts longer than 8 days.  Your periods are recurring sooner than 21 days.  You have bleeding after  intercourse.  You have severe depression.  You have pain when you urinate.  You have severe headaches.  You have vision problems. This information is not intended to replace advice given to you by your health care provider. Make sure you discuss any questions you have with your health care provider. Document Released: 10/18/2004 Document Revised: 02/16/2016 Document Reviewed: 04/09/2013 [employer] Interactive Patient Education  2017 [employer]  

## 2018-04-09 NOTE — Progress Notes (Signed)
GYNECOLOGY ANNUAL PHYSICAL EXAM PROGRESS NOTE  Subjective:    Valerie Mcdaniel is a 47 y.o. G2P2 female who presents for an annual exam. The patient is sexually active. The patient wears seatbelts: yes. The patient participates in regular exercise: no. Has the patient ever been transfused or tattooed?: no. The patient reports that there is not domestic violence in her life.    The patient has the following complaints today:  1. Complains of hot flushes and night sweats over the past few months.   Gynecologic History  Menarche age: 28 No LMP recorded. Patient has had an ablation. Contraception: none History of STI's: Denies Last Pap: 2016. Results were: normal.  Denies/ h/o abnormal pap smears. Last mammogram: 07/18/2017. Results were: normal   OB History  Gravida Para Term Preterm AB Living  2 2 0 0 0 2  SAB TAB Ectopic Multiple Live Births  0 0 0 0 0    # Outcome Date GA Lbr Len/2nd Weight Sex Delivery Anes PTL Lv  2 Para           1 Para             Obstetric Comments  1st Menstrual Cycle:  11   1st Pregnancy:  18    Past Medical History:  Diagnosis Date  . Cancer [facility]) 2014   right breast, DCIS, ER/PR positive  . GERD (gastroesophageal reflux disease)   . Hypertension   . Personal history of radiation therapy   . TIA (transient ischemic attack)     Past Surgical History:  Procedure Laterality Date  . ABLATION  01-03-15   D & C  . BREAST BIOPSY Right 2014   +  . BREAST SURGERY Right 06/29/13   lumpectomy  . CESAREAN SECTION  March 1997  . NECK LESION BIOPSY  1989    Family History  Problem Relation Age of Onset  . Cancer Father        lung  . Cancer Maternal Aunt        breast  . Breast cancer Maternal Aunt   . Cancer Maternal Grandmother        throat    Social History   Socioeconomic History  . Marital status: Married    Spouse name: Not on file  . Number of children: Not on file  . Years of education: Not on file  . Highest education  level: Not on file  Occupational History  . Not on file  Social Needs  . Financial resource strain: Not on file  . Food insecurity:    Worry: Not on file    Inability: Not on file  . Transportation needs:    Medical: Not on file    Non-medical: Not on file  Tobacco Use  . Smoking status: Current Every Day Smoker    Packs/day: 1.00    Types: Cigarettes  . Smokeless tobacco: Never Used  Substance and Sexual Activity  . Alcohol use: No  . Drug use: No  . Sexual activity: Not Currently    Birth control/protection: None  Lifestyle  . Physical activity:    Days per week: Not on file    Minutes per session: Not on file  . Stress: Not on file  Relationships  . Social connections:    Talks on phone: Not on file    Gets together: Not on file    Attends religious service: Not on file    Active member of club or organization: Not  on file    Attends meetings of clubs or organizations: Not on file    Relationship status: Not on file  . Intimate partner violence:    Fear of current or ex partner: Not on file    Emotionally abused: Not on file    Physically abused: Not on file    Forced sexual activity: Not on file  Other Topics Concern  . Not on file  Social History Narrative  . Not on file    Current Outpatient Medications on File Prior to Visit  Medication Sig Dispense Refill  . aspirin 325 MG tablet Take 325 mg by mouth daily.    . clopidogrel (PLAVIX) 75 MG tablet Take 75 mg by mouth daily.    Marland Kitchen lisinopril (PRINIVIL,ZESTRIL) 10 MG tablet Take 10 mg by mouth daily.    . pantoprazole (PROTONIX) 40 MG tablet Take 40 mg by mouth daily.    . pravastatin (PRAVACHOL) 10 MG tablet Take 10 mg by mouth daily.    . Calcium Carbonate-Vit D-Min (CALCIUM 1200 PO) Take 1 tablet by mouth daily.    . Multiple Vitamin (MULTIVITAMIN) capsule Take 1 capsule by mouth daily.     No current facility-administered medications on file prior to visit.     No Known Allergies    Review of  Systems Constitutional: negative for chills, fatigue, fevers and sweats Eyes: negative for irritation, redness and visual disturbance Ears, nose, mouth, throat, and face: negative for hearing loss, nasal congestion, snoring and tinnitus Respiratory: negative for asthma, cough, sputum Cardiovascular: negative for chest pain, dyspnea, exertional chest pressure/discomfort, irregular heart beat, palpitations and syncope Gastrointestinal: negative for abdominal pain, change in bowel habits, nausea and vomiting Genitourinary: negative for abnormal menstrual periods, genital lesions, sexual problems and vaginal discharge, dysuria and urinary incontinence. Positive for hot flushes, night sweats Integument/breast: negative for breast lump, breast tenderness and nipple discharge Hematologic/lymphatic: negative for bleeding and easy bruising Musculoskeletal:negative for back pain and muscle weakness Neurological: negative for dizziness, headaches, vertigo and weakness Endocrine: negative for diabetic symptoms including polydipsia, polyuria and skin dryness Allergic/Immunologic: negative for hay fever and urticaria      Objective:  Blood pressure 125/87, pulse 96, height 5\' 6"  (1.676 m), weight 179 lb 14.4 oz (81.6 kg). Body mass index is 29.04 kg/m.  General Appearance:    Alert, cooperative, no distress, appears stated age, overweight  Head:    Normocephalic, without obvious abnormality, atraumatic  Eyes:    PERRL, conjunctiva/corneas clear, EOM's intact, both eyes  Ears:    Normal external ear canals, both ears  Nose:   Nares normal, septum midline, mucosa normal, no drainage or sinus tenderness  Throat:   Lips, mucosa, and tongue normal; teeth and gums normal  Neck:   Supple, symmetrical, trachea midline, no adenopathy; thyroid: no enlargement/tenderness/nodules; no carotid bruit or JVD  Back:     Symmetric, no curvature, ROM normal, no CVA tenderness  Lungs:     Clear to auscultation  bilaterally, respirations unlabored  Chest Wall:    No tenderness or deformity   Heart:    Regular rate and rhythm, S1 and S2 normal, no murmur, rub or gallop  Breast Exam:    No tenderness, masses, or nipple abnormality  Abdomen:     Soft, non-tender, bowel sounds active all four quadrants, no masses, no organomegaly.    Genitalia:    Pelvic:external genitalia normal, vagina without lesions, discharge, or tenderness, rectovaginal septum  normal. Cervix normal in appearance, no cervical motion  tenderness, no adnexal masses or tenderness.  Uterus normal size, shape, mobile, regular contours, nontender.  Rectal:    Normal external sphincter.  No hemorrhoids appreciated. Internal exam not done.   Extremities:   Extremities normal, atraumatic, no cyanosis or edema  Pulses:   2+ and symmetric all extremities  Skin:   Skin color, texture, turgor normal, no rashes or lesions  Lymph nodes:   Cervical, supraclavicular, and axillary nodes normal  Neurologic:   CNII-XII intact, normal strength, sensation and reflexes throughout   .  Labs:  Lab Results  Component Value Date   WBC 12.3 (H) 09/09/2013   HGB 13.8 12/30/2014   HCT 42.1 09/09/2013   MCV 97 09/09/2013   PLT 344 09/09/2013    Lab Results  Component Value Date   CREATININE 0.59 (L) 01/21/2013   BUN 7 01/21/2013   NA 137 01/21/2013   K 4.3 01/21/2013   CL 106 01/21/2013   CO2 25 01/21/2013    Lab Results  Component Value Date   ALT 16 02/25/2012   AST 10 (L) 02/25/2012   ALKPHOS 83 02/25/2012   BILITOT 0.3 02/25/2012    No results found for: TSH   Assessment:    Healthy female exam.   Perimenopausal vasomotor symptoms  H/o breast cancer Overweight.  Dyslipidemia  Plan:     Blood tests: CBC with diff, Comprehensive metabolic panel, Estradiol, FSH, LH, Progesterone level and TSH.  Patient ntoes that PCP likes to check her lipid panel, will have checked in 2 months.  Breast self exam technique reviewed and patient  encouraged to perform self-exam monthly. Contraception: none. Discussed healthy lifestyle modifications. Mammogram up to date. Continue to f/u yearly with General Surgery for h/o breast cancer.  Pap smear performed today.   Patient with bothersome perimenopausal vasomotor symptoms. Discussed lifestyle interventions such as wearing light clothing, remaining in cool environments, having fan/air conditioner in the room, avoiding hot beverages etc.  Patient is not a candidate for HRT due to her h/o breast cancer and TIA. Discussed other medical options such as Paxil, Effexor, Brisdelle, Clonidine,  or Neurontin.   Also discussed alternative therapies such as herbal remedies but cautioned that most of the products contained phytoestrogens (plant estrogens) in unregulated amounts which can have the same effects on the body as the pharmaceutical estrogen preparations. Patient reports that she has tried these recently and did not notice any change in symptoms.  After discussion, will prescribe Effexor for symptoms. To f/u in 4 weeks to reassess.  Follow up in 1 year for annual exam.    Rubie Maid, MD [facility]

## 2018-04-09 NOTE — Progress Notes (Signed)
Pt stated that she is having hot flashes and has a bump on the inner area of her thigh.

## 2018-04-10 LAB — CBC
HEMATOCRIT: 42.6 % (ref 34.0–46.6)
HEMOGLOBIN: 14.7 g/dL (ref 11.1–15.9)
MCH: 32.6 pg (ref 26.6–33.0)
MCHC: 34.5 g/dL (ref 31.5–35.7)
MCV: 95 fL (ref 79–97)
Platelets: 321 10*3/uL (ref 150–450)
RBC: 4.51 x10E6/uL (ref 3.77–5.28)
RDW: 13.9 % (ref 12.3–15.4)
WBC: 8.5 10*3/uL (ref 3.4–10.8)

## 2018-04-10 LAB — COMPREHENSIVE METABOLIC PANEL
ALBUMIN: 4.2 g/dL (ref 3.5–5.5)
ALT: 14 IU/L (ref 0–32)
AST: 13 IU/L (ref 0–40)
Albumin/Globulin Ratio: 1.2 (ref 1.2–2.2)
Alkaline Phosphatase: 123 IU/L — ABNORMAL HIGH (ref 39–117)
BUN/Creatinine Ratio: 10 (ref 9–23)
BUN: 8 mg/dL (ref 6–24)
Bilirubin Total: 0.3 mg/dL (ref 0.0–1.2)
CO2: 23 mmol/L (ref 20–29)
CREATININE: 0.8 mg/dL (ref 0.57–1.00)
Calcium: 9.8 mg/dL (ref 8.7–10.2)
Chloride: 100 mmol/L (ref 96–106)
GFR calc Af Amer: 101 mL/min/{1.73_m2} (ref >59)
GFR, EST NON AFRICAN AMERICAN: 87 mL/min/{1.73_m2} (ref >59)
GLOBULIN, TOTAL: 3.6 g/dL (ref 1.5–4.5)
Glucose: 94 mg/dL (ref 65–99)
Potassium: 4.4 mmol/L (ref 3.5–5.2)
SODIUM: 137 mmol/L (ref 134–144)
Total Protein: 7.8 g/dL (ref 6.0–8.5)

## 2018-04-10 LAB — TSH: TSH: 1.7 u[IU]/mL (ref 0.450–4.500)

## 2018-04-10 LAB — FSH/LH
FSH: 85 m[IU]/mL
LH: 52.9 m[IU]/mL

## 2018-04-10 LAB — ESTRADIOL: Estradiol: <5 pg/mL

## 2018-04-10 LAB — PROGESTERONE: PROGESTERONE: 0.2 ng/mL

## 2018-04-11 LAB — CYTOLOGY - PAP
DIAGNOSIS: NEGATIVE
HPV (WINDOPATH): NOT DETECTED

## 2018-05-07 ENCOUNTER — Encounter: Payer: Self-pay | Admitting: Obstetrics and Gynecology

## 2018-05-07 ENCOUNTER — Telehealth: Payer: Self-pay | Admitting: Obstetrics and Gynecology

## 2018-05-07 ENCOUNTER — Ambulatory Visit: Payer: BLUE CROSS/BLUE SHIELD | Admitting: Obstetrics and Gynecology

## 2018-05-07 VITALS — BP 126/79 | HR 93 | Ht 66.0 in | Wt 183.2 lb

## 2018-05-07 DIAGNOSIS — N951 Menopausal and female climacteric states: Secondary | ICD-10-CM | POA: Diagnosis not present

## 2018-05-07 DIAGNOSIS — R4586 Emotional lability: Secondary | ICD-10-CM | POA: Diagnosis not present

## 2018-05-07 DIAGNOSIS — G479 Sleep disorder, unspecified: Secondary | ICD-10-CM | POA: Diagnosis not present

## 2018-05-07 NOTE — Telephone Encounter (Signed)
The patient called and stated that her pharmacy called and stated that they have partial amount of medication that she needs and is unsure if they will have the rest on Friday of this week. Please advise.

## 2018-05-07 NOTE — Progress Notes (Signed)
.     GYNECOLOGY PROGRESS NOTE  Subjective:    Patient ID: Valerie Mcdaniel, female    DOB: 1971/08/21, 47 y.o.   MRN: [id number]  HPI  Patient is a 47 y.o. G2P2 female who presents for f/u of menopausal vasomotor symptoms and mood changes.  Was initiated on Effexor. Notes for the first week she noted GI symptoms, however has subsided.  Notes that her mood and hot flashes have improved but are not completely gone.  Is much more manageable. She is however concerned about the medication and the requirements for weaning. Notes that her PCP also raised concern. .   She does however note that she is still having difficulties with sleeping. Reports problems falling asleep and staying asleep. She has been taking Melatonin as needed, but does not want to have to depend on something nightly to be able to sleep.   The following portions of the patient's history were reviewed and updated as appropriate: allergies, current medications, past family history, past medical history, past social history, past surgical history and problem list.  Review of Systems Pertinent items noted in HPI and remainder of comprehensive ROS otherwise negative.   Objective:   Blood pressure 126/79, pulse 93, height 5\' 6"  (1.676 m), weight 183 lb 3.2 oz (83.1 kg). General appearance: alert and no distress Remainder of exam deferred.    Assessment:   Vasomotor syndrome Mood changes Sleep disturbances  Plan:   - Discussion had on continued management of vasomotor syndromes. Reiterated that it is not recommended for her to use HRT due to medical history (breast cancer and TIA). Discussed that she can remain on current medication and discussed weaning process, or if she still has concerns and would like to try a different non-hormonal option, she could also do this. Patient has decided to remain on the Effexor for now.   - Sleep disturbances, discussed sleep hygiene (turning tv off when not watching at night, not watching tv in  bedroom, decreasing caffeine intake, warm bath, lavender aromatherapy, etc).  - Return to clinic for any scheduled appointments or for any gynecologic concerns as needed.     A total of 15 minutes were spent face-to-face with the patient during this encounter and over half of that time dealt with counseling and coordination of care.   Rubie Maid, MD [facility]

## 2018-05-07 NOTE — Progress Notes (Signed)
Pt stated that the medication is helping a little. No other complaints.

## 2018-05-07 NOTE — Telephone Encounter (Signed)
The patient called and stated that she is down to 2 pills on her medication, The patient stated that her pharmacy  stating that the medication is on back order and does not have it in the pharmacy, The patient would like to know if she should go to a different pharmacy or if a solution can be made due to her running out of her medication. Please advise.

## 2018-05-08 ENCOUNTER — Encounter: Payer: Self-pay | Admitting: Obstetrics and Gynecology

## 2018-05-08 NOTE — Telephone Encounter (Signed)
Please see another phone encounter. Pt medication arrived and she picked up medication from pharmacy.

## 2018-05-08 NOTE — Telephone Encounter (Signed)
Pt was called and informed that her medication was ready to be picked up the pharmacy.

## 2018-06-17 ENCOUNTER — Other Ambulatory Visit: Payer: Self-pay | Admitting: Obstetrics and Gynecology

## 2018-06-20 ENCOUNTER — Other Ambulatory Visit: Payer: Self-pay

## 2018-06-20 DIAGNOSIS — D0511 Intraductal carcinoma in situ of right breast: Secondary | ICD-10-CM

## 2018-07-25 ENCOUNTER — Ambulatory Visit
Admission: RE | Admit: 2018-07-25 | Discharge: 2018-07-25 | Disposition: A | Discharge status: Home / Self Care | Payer: BLUE CROSS/BLUE SHIELD | Source: Ambulatory Visit | Attending: General Surgery | Admitting: General Surgery

## 2018-07-25 DIAGNOSIS — D0511 Intraductal carcinoma in situ of right breast: Secondary | ICD-10-CM

## 2018-07-30 ENCOUNTER — Other Ambulatory Visit: Payer: Self-pay | Admitting: Obstetrics and Gynecology

## 2018-08-02 ENCOUNTER — Other Ambulatory Visit: Payer: Self-pay | Admitting: Obstetrics and Gynecology

## 2018-08-05 ENCOUNTER — Other Ambulatory Visit: Payer: Self-pay

## 2018-08-05 ENCOUNTER — Ambulatory Visit (INDEPENDENT_AMBULATORY_CARE_PROVIDER_SITE_OTHER): Payer: BLUE CROSS/BLUE SHIELD | Admitting: General Surgery

## 2018-08-05 VITALS — BP 128/78 | HR 96 | Resp 16 | Ht 66.0 in | Wt 189.0 lb

## 2018-08-05 DIAGNOSIS — D0511 Intraductal carcinoma in situ of right breast: Secondary | ICD-10-CM | POA: Diagnosis not present

## 2018-08-05 NOTE — Patient Instructions (Signed)
The patient is aware to call back for any questions or concerns. Patient will be asked to return to the office in one year with a bilateral screening mammogram. 

## 2018-08-05 NOTE — Progress Notes (Signed)
Patient ID: Valerie Mcdaniel, female   DOB: 12/04/1970, 47 y.o.   MRN: [id number]  Chief Complaint  Patient presents with  . Follow-up    HPI Valerie Mcdaniel is a 47 y.o. female. who presents for her follow up right breast cancer and a breast evaluation. The most recent mammogram was done on 07-25-18.  Patient does perform regular self breast checks and gets regular mammograms done.  She states she feels like the right breast is smaller than before. Her husband has seen some red marks on the right breast. She  is here with her husband, Valerie Mcdaniel.  HPI  Past Medical History:  Diagnosis Date  . Cancer [facility]) 2014   right breast, DCIS, ER/PR positive  . GERD (gastroesophageal reflux disease)   . Hypertension   . Personal history of radiation therapy 2014   RIGHT lumpectomy  . TIA (transient ischemic attack)     Past Surgical History:  Procedure Laterality Date  . ABLATION  01-03-15   D & C  . BREAST BIOPSY Right 2014   +  . BREAST LUMPECTOMY Right 2014   right breast, DCIS, ER/PR positive  . BREAST SURGERY Right 06/29/13   lumpectomy  . CESAREAN SECTION  March 1997  . NECK LESION BIOPSY  1989    Family History  Problem Relation Age of Onset  . Cancer Father        lung  . Cancer Maternal Aunt        breast  . Breast cancer Maternal Aunt   . Cancer Maternal Grandmother        throat    Social History Social History   Tobacco Use  . Smoking status: Current Every Day Smoker    Packs/day: 1.00    Types: Cigarettes  . Smokeless tobacco: Never Used  Substance Use Topics  . Alcohol use: No  . Drug use: No    No Known Allergies  Current Outpatient Medications  Medication Sig Dispense Refill  . aspirin 325 MG tablet Take 325 mg by mouth daily.    . clopidogrel (PLAVIX) 75 MG tablet Take 75 mg by mouth daily.    Marland Kitchen lisinopril (PRINIVIL,ZESTRIL) 10 MG tablet Take 10 mg by mouth daily.    . Multiple Vitamin (MULTIVITAMIN) capsule Take 1 capsule by mouth daily.    .  pantoprazole (PROTONIX) 40 MG tablet Take 40 mg by mouth daily.    . pravastatin (PRAVACHOL) 20 MG tablet Take 20 mg by mouth daily.    Marland Kitchen venlafaxine XR (EFFEXOR-XR) 75 MG 24 hr capsule TAKE 1 CAPSULE BY MOUTH ONCE DAILY 30 capsule 0   No current facility-administered medications for this visit.     Review of Systems Review of Systems  Constitutional: Negative.   Respiratory: Negative.   Cardiovascular: Negative.     Blood pressure 128/78, pulse 96, resp. rate 16, height 5\' 6"  (1.676 m), weight 189 lb (85.7 kg), SpO2 97 %.  Physical Exam Physical Exam  Constitutional: She is oriented to person, place, and time. She appears well-developed and well-nourished.  HENT:  Mouth/Throat: Oropharynx is clear and moist.  Eyes: Conjunctivae are normal. No scleral icterus.  Neck: Neck supple.  Cardiovascular: Normal rate, regular rhythm and normal heart sounds.  Pulmonary/Chest: Effort normal and breath sounds normal. Right breast exhibits no inverted nipple, no mass, no nipple discharge, no skin change and no tenderness. Left breast exhibits no inverted nipple, no mass, no nipple discharge, no skin change and no tenderness.  Red blood  vessels right breast. Right breast lumpectomy site well healed.    Lymphadenopathy:    She has no cervical adenopathy.    She has no axillary adenopathy.  Neurological: She is alert and oriented to person, place, and time.  Skin: Skin is warm and dry.  Psychiatric: Her behavior is normal.    Data Reviewed Bilateral mammograms dated July 25, 2018 were reviewed.  BI-RADS-2.  Annual screening exams recommended for next year.    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  No antiestrogen therapy due to history of TIA.  Ongoing smoking.    Plan    Recommend genetic testing if not done in past. Will have her records reviewed to see if this was completed as she was in her early 65s original diagnosis.  The patient is aware to call back for  any questions or new concerns.  Patient will be asked to return to the office in one year with a bilateral screening mammogram.      HPI, Physical Exam, Assessment and Plan have been scribed under the direction and in the presence of Valerie Bellow, MD. Valerie Fetch, RN  I have completed the exam and reviewed the above documentation for accuracy and completeness.  I agree with the above.  Valerie Mcdaniel has been used and any errors in dictation or transcription are unintentional.  Valerie Mcdaniel, M.D., F.A.C.S.  Valerie Mcdaniel 08/05/2018, 7:54 PM

## 2018-08-06 ENCOUNTER — Encounter: Payer: Self-pay | Admitting: Obstetrics and Gynecology

## 2018-08-06 ENCOUNTER — Ambulatory Visit: Payer: BLUE CROSS/BLUE SHIELD | Admitting: Obstetrics and Gynecology

## 2018-08-06 VITALS — BP 115/74 | HR 97 | Ht 66.0 in | Wt 189.7 lb

## 2018-08-06 DIAGNOSIS — N951 Menopausal and female climacteric states: Secondary | ICD-10-CM

## 2018-08-06 DIAGNOSIS — G479 Sleep disorder, unspecified: Secondary | ICD-10-CM

## 2018-08-06 MED ORDER — HYDROXYZINE PAMOATE 50 MG PO CAPS: 50.0000 mg | ORAL_CAPSULE | Freq: Three times a day (TID) | ORAL | 3 refills | Status: DC | PRN

## 2018-08-06 NOTE — Progress Notes (Signed)
    GYNECOLOGY PROGRESS NOTE  Subjective:    Patient ID: Valerie Mcdaniel, female    DOB: 04-Dec-1970, 47 y.o.   MRN: [id number]  HPI  Patient is a 47 y.o. G2P2 female who presents for medication follow up.  Was initiated on Effexor for vasomotor symptoms in July.  Initially was concerned regarding the side effects of the medication and the need for weaning once no longer needed, but after further discussion decided to remain on the medication.  She now notes that she has gotten used to the medication and is otherwise doing well.    She is still noting difficulties with sleeping (usually due to mind-racing).  Has been taken Melatonin, which she notes helps some, but tries not to take it every night.   The following portions of the patient's history were reviewed and updated as appropriate: allergies, current medications, past family history, past medical history, past social history, past surgical history and problem list.  Review of Systems Pertinent items noted in HPI and remainder of comprehensive ROS otherwise negative.   Objective:   Blood pressure 115/74, pulse 97, height 5\' 6"  (1.676 m), weight 189 lb 11.2 oz (86 kg). General appearance: alert and no distress Remainder of exam deferred.    Assessment:   Menopausal symptoms Sleep disturbances  Plan:   - Patient to continue use of Effexor for her hot flushes and mood swings.  Notes overall improvement in symptoms. Will assess symptoms annually.  - Discussed other options for helping with sleep issues, as patient mostly notes mind-racing.  She is now better able to stay asleep during the night but still has difficulty falling asleep. Discussed prescription for Vistaril to help with possible anxiousness before sleeping. Patient ok to try.  - Return to clinic for any scheduled appointments or for any gynecologic concerns as needed. Return if symptoms fail to improve or worsen.    Rubie Maid, MD [facility]

## 2018-08-06 NOTE — Progress Notes (Signed)
Pt is present today for a follow up from medication review. Pt stated that the medication is working well no complaints.

## 2018-08-07 ENCOUNTER — Telehealth: Payer: Self-pay | Admitting: Obstetrics and Gynecology

## 2018-08-07 MED ORDER — HYDROXYZINE HCL 50 MG PO TABS: 50.0000 mg | ORAL_TABLET | Freq: Three times a day (TID) | ORAL | 3 refills | Status: DC | PRN

## 2018-08-07 NOTE — Telephone Encounter (Signed)
Shirlean Mylar called from the pharmacy and stated the script came thru as caps and 1/2 tab first, but it needs to be tabs so the patient can split it, please send over clarification, thanks.

## 2018-08-07 NOTE — Telephone Encounter (Signed)
Spoke with Shirlean Mylar and informed her that the changes were made to the pt's medication.

## 2018-08-26 ENCOUNTER — Other Ambulatory Visit: Payer: Self-pay | Admitting: Obstetrics and Gynecology

## 2019-04-14 ENCOUNTER — Encounter: Payer: Self-pay | Admitting: Obstetrics and Gynecology

## 2019-04-14 ENCOUNTER — Ambulatory Visit (INDEPENDENT_AMBULATORY_CARE_PROVIDER_SITE_OTHER): Payer: BLUE CROSS/BLUE SHIELD | Admitting: Obstetrics and Gynecology

## 2019-04-14 ENCOUNTER — Other Ambulatory Visit: Payer: Self-pay

## 2019-04-14 VITALS — BP 110/76 | HR 80 | Ht 66.0 in | Wt 186.0 lb

## 2019-04-14 DIAGNOSIS — N951 Menopausal and female climacteric states: Secondary | ICD-10-CM

## 2019-04-14 DIAGNOSIS — Z1231 Encounter for screening mammogram for malignant neoplasm of breast: Secondary | ICD-10-CM

## 2019-04-14 DIAGNOSIS — Z01419 Encounter for gynecological examination (general) (routine) without abnormal findings: Secondary | ICD-10-CM

## 2019-04-14 DIAGNOSIS — E785 Hyperlipidemia, unspecified: Secondary | ICD-10-CM

## 2019-04-14 DIAGNOSIS — R638 Other symptoms and signs concerning food and fluid intake: Secondary | ICD-10-CM

## 2019-04-14 DIAGNOSIS — Z853 Personal history of malignant neoplasm of breast: Secondary | ICD-10-CM | POA: Diagnosis not present

## 2019-04-14 NOTE — Progress Notes (Signed)
GYNECOLOGY ANNUAL PHYSICAL EXAM PROGRESS NOTE  Subjective:    Valerie Mcdaniel is a 48 y.o. G2P2 female who presents for an annual exam. The patient is not sexually active. The patient wears seatbelts: yes. The patient participates in regular exercise: no. Has the patient ever been transfused or tattooed?: no. The patient reports that there is not domestic violence in her life. She is still taking the effexor, and it's not working for her. Even with AC on, the hot flashes are out of control. She also endorses fatigue, weight gain. Dr. Hall Busing is her PCP and does her labs. He does her cholesterol, sugar, LFTs, etc.- not in system - sugar was 111, cholesterol was better per pt. Not currently exercising due to gym being closed, but she is eating veggies. Taking nothing for birth control- tubes tied. No vaginal bleeding/spotting.   The patient has the following complaints today:  1. Complains of hot flushes and night sweats over the past few months.   Gynecologic History  Menarche age: 51 Contraception: none- tubes tied, not currently sexually active.  History of STI's: Denies Last Pap with HPV: 03/2018, normal. Denies/ h/o abnormal pap smears. Last mammogram: 07/2018, normal. 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    OB History  Gravida Para Term Preterm AB Living  2 2 0 0 0 2  SAB TAB Ectopic Multiple Live Births  0 0 0 0 0    # Outcome Date GA Lbr Len/2nd Weight Sex Delivery Anes PTL Lv  2 Para           1 Para             Obstetric Comments  1st Menstrual Cycle:  11   1st Pregnancy:  18    Past Medical History:  Diagnosis Date  . Cancer [facility]) 2014   right breast, DCIS, ER/PR positive  . GERD (gastroesophageal reflux disease)   . Hypertension   . Personal history of radiation therapy 2014   RIGHT lumpectomy  . TIA (transient ischemic attack)     Past Surgical History:  Procedure Laterality Date  . ABLATION  01-03-15   D & C  .  BREAST BIOPSY Right 2014   +  . BREAST LUMPECTOMY Right 2014   right breast, DCIS, ER/PR positive  . BREAST SURGERY Right 06/29/13   lumpectomy  . CESAREAN SECTION  March 1997  . NECK LESION BIOPSY  1989    Family History  Problem Relation Age of Onset  . Cancer Father        lung  . Cancer Maternal Aunt        breast  . Breast cancer Maternal Aunt   . Cancer Maternal Grandmother        throat    Social History   Socioeconomic History  . Marital status: Married    Spouse name: Not on file  . Number of children: Not on file  . Years of education: Not on file  . Highest education level: Not on file  Occupational History  . Not on file  Social Needs  . Financial resource strain: Not on file  . Food insecurity    Worry: Not on file    Inability: Not on file  . Transportation needs    Medical: Not on file    Non-medical: Not on file  Tobacco Use  . Smoking status: Current Every Day Smoker    Packs/day: 1.00  Types: Cigarettes  . Smokeless tobacco: Never Used  Substance and Sexual Activity  . Alcohol use: No  . Drug use: No  . Sexual activity: Not Currently    Birth control/protection: None  Lifestyle  . Physical activity    Days per week: Not on file    Minutes per session: Not on file  . Stress: Not on file  Relationships  . Social Herbalist on phone: Not on file    Gets together: Not on file    Attends religious service: Not on file    Active member of club or organization: Not on file    Attends meetings of clubs or organizations: Not on file    Relationship status: Not on file  . Intimate partner violence    Fear of current or ex partner: Not on file    Emotionally abused: Not on file    Physically abused: Not on file    Forced sexual activity: Not on file  Other Topics Concern  . Not on file  Social History Narrative  . Not on file    Current Outpatient Medications on File Prior to Visit  Medication Sig Dispense Refill  . aspirin  325 MG tablet Take 325 mg by mouth daily.    . clopidogrel (PLAVIX) 75 MG tablet Take 75 mg by mouth daily.    . hydrOXYzine (ATARAX/VISTARIL) 50 MG tablet Take 1 tablet (50 mg total) by mouth 3 (three) times daily as needed. 45 tablet 3  . lisinopril (PRINIVIL,ZESTRIL) 10 MG tablet Take 10 mg by mouth daily.    . Multiple Vitamin (MULTIVITAMIN) capsule Take 1 capsule by mouth daily.    . pantoprazole (PROTONIX) 40 MG tablet Take 40 mg by mouth daily.    . pravastatin (PRAVACHOL) 20 MG tablet Take 20 mg by mouth daily.    Marland Kitchen terbinafine (LAMISIL) 250 MG tablet Take 250 mg by mouth daily.    Marland Kitchen venlafaxine XR (EFFEXOR-XR) 75 MG 24 hr capsule TAKE 1 CAPSULE BY MOUTH ONCE DAILY 30 capsule 10   No current facility-administered medications on file prior to visit.     No Known Allergies    Review of Systems Constitutional: negative for chills, fatigue, fevers and sweats Eyes: negative for irritation, redness and visual disturbance Ears, nose, mouth, throat, and face: negative for hearing loss, nasal congestion, snoring and tinnitus Respiratory: negative for asthma, cough, sputum Cardiovascular: negative for chest pain, dyspnea, exertional chest pressure/discomfort, irregular heart beat, palpitations and syncope Gastrointestinal: negative for abdominal pain, change in bowel habits, nausea and vomiting Genitourinary: negative for abnormal menstrual periods, genital lesions, sexual problems and vaginal discharge, dysuria and urinary incontinence. Positive for hot flushes, night sweats Integument/breast: negative for breast lump, breast tenderness and nipple discharge Hematologic/lymphatic: negative for bleeding and easy bruising Musculoskeletal:negative for back pain and muscle weakness Neurological: negative for dizziness, headaches, vertigo and weakness Endocrine: negative for diabetic symptoms including polydipsia, polyuria and skin dryness Allergic/Immunologic: negative for hay fever and  urticaria      Objective:  Blood pressure 110/76, pulse 80, height 5\' 6"  (1.676 m), weight 84.4 kg. Body mass index is 30.02 kg/m.  General Appearance:    Alert, cooperative, no distress, appears stated age, overweight, unchanged  Head:    Normocephalic, without obvious abnormality, atraumatic  Eyes:    PERRL, conjunctiva/corneas clear, EOM's intact, both eyes  Ears:    Normal external ear canals, both ears  Nose:   Nares normal, septum midline, mucosa normal, no  drainage or sinus tenderness  Throat:   Lips, mucosa, and tongue normal; teeth and gums normal  Neck:   Supple, symmetrical, trachea midline, no adenopathy; thyroid: no enlargement/tenderness/nodules; no carotid bruit or JVD  Back:     Symmetric, no curvature, ROM normal, no CVA tenderness  Lungs:     Clear to auscultation bilaterally, respirations unlabored  Chest Wall:    No tenderness or deformity   Heart:    Regular rate and rhythm, S1 and S2 normal, no murmur, rub or gallop  Breast Exam:    No tenderness, masses, or nipple abnormality  Abdomen:     Soft, non-tender, bowel sounds active all four quadrants, no masses, no organomegaly.    Genitalia:    Pelvic:external genitalia normal, vagina without lesions, discharge, or tenderness, rectovaginal septum  normal. Cervix normal in appearance, no cervical motion tenderness, no adnexal masses or tenderness.  Uterus normal size, shape, mobile, regular contours, nontender.  Rectal:    Normal external sphincter.  No hemorrhoids appreciated. Internal exam not done.   Extremities:   Extremities normal, atraumatic, no cyanosis or edema  Pulses:   2+ and symmetric all extremities  Skin:   Skin color, texture, turgor normal, no rashes or lesions  Lymph nodes:   Cervical, supraclavicular, and axillary nodes normal  Neurologic:   CNII-XII intact, normal strength, sensation and reflexes throughout   .  Labs:  Lab Results  Component Value Date   WBC 8.5 04/09/2018   HGB 14.7 04/09/2018    HCT 42.6 04/09/2018   MCV 95 04/09/2018   PLT 321 04/09/2018    Lab Results  Component Value Date   CREATININE 0.80 04/09/2018   BUN 8 04/09/2018   NA 137 04/09/2018   K 4.4 04/09/2018   CL 100 04/09/2018   CO2 23 04/09/2018    Lab Results  Component Value Date   ALT 14 04/09/2018   AST 13 04/09/2018   ALKPHOS 123 (H) 04/09/2018   BILITOT 0.3 04/09/2018    Lab Results  Component Value Date   TSH 1.700 04/09/2018     Assessment:    Healthy female exam.   Perimenopausal vasomotor symptoms  H/o breast cancer Overweight.    Plan:     Blood tests: none- PCP does her labs. Breast self exam technique reviewed and patient encouraged to perform self-exam monthly. Contraception: none, h/o tubes tied. Discussed healthy lifestyle modifications. Mammogram up to date. Continue to f/u yearly with General Surgery for h/o breast cancer.   Hot flashes not at goal on Effexor. Previously also tried Paxil with no relief. Discussed options for management, including Clonidine, Gabapentin. Pt not wishing to start another medication. Rec trial of OTC Evening Primrose Oil, Soy/Sweet Potatoes, Peppermint oil, etc; list provided. Pt also on Plavix.   Follow up in 1 year for annual exam, or sooner as needed.   Marinus Maw  04/14/19 [facility]

## 2019-04-14 NOTE — Patient Instructions (Signed)
Preventive Care 49-49 Years Old, Female Preventive care refers to visits with your health care provider and lifestyle choices that can promote health and wellness. This includes:  A yearly physical exam. This may also be called an annual well check.  Regular dental visits and eye exams.  Immunizations.  Screening for certain conditions.  Healthy lifestyle choices, such as eating a healthy diet, getting regular exercise, not using drugs or products that contain nicotine and tobacco, and limiting alcohol use. What can I expect for my preventive care visit? Physical exam Your health care provider will check your:  Height and weight. This may be used to calculate body mass index (BMI), which tells if you are at a healthy weight.  Heart rate and blood pressure.  Skin for abnormal spots. Counseling Your health care provider may ask you questions about your:  Alcohol, tobacco, and drug use.  Emotional well-being.  Home and relationship well-being.  Sexual activity.  Eating habits.  Work and work environment.  Method of birth control.  Menstrual cycle.  Pregnancy history. What immunizations do I need?  Influenza (flu) vaccine  This is recommended every year. Tetanus, diphtheria, and pertussis (Tdap) vaccine  You may need a Td booster every 10 years. Varicella (chickenpox) vaccine  You may need this if you have not been vaccinated. Zoster (shingles) vaccine  You may need this after age 60. Measles, mumps, and rubella (MMR) vaccine  You may need at least one dose of MMR if you were born in 1957 or later. You may also need a second dose. Pneumococcal conjugate (PCV13) vaccine  You may need this if you have certain conditions and were not previously vaccinated. Pneumococcal polysaccharide (PPSV23) vaccine  You may need one or two doses if you smoke cigarettes or if you have certain conditions. Meningococcal conjugate (MenACWY) vaccine  You may need this if you  have certain conditions. Hepatitis A vaccine  You may need this if you have certain conditions or if you travel or work in places where you may be exposed to hepatitis A. Hepatitis B vaccine  You may need this if you have certain conditions or if you travel or work in places where you may be exposed to hepatitis B. Haemophilus influenzae type b (Hib) vaccine  You may need this if you have certain conditions. Human papillomavirus (HPV) vaccine  If recommended by your health care provider, you may need three doses over 6 months. You may receive vaccines as individual doses or as more than one vaccine together in one shot (combination vaccines). Talk with your health care provider about the risks and benefits of combination vaccines. What tests do I need? Blood tests  Lipid and cholesterol levels. These may be checked every 5 years, or more frequently if you are over 49 years old.  Hepatitis C test.  Hepatitis B test. Screening  Lung cancer screening. You may have this screening every year starting at age 55 if you have a 30-pack-year history of smoking and currently smoke or have quit within the past 15 years.  Colorectal cancer screening. All adults should have this screening starting at age 49 and continuing until age 75. Your health care provider may recommend screening at age 45 if you are at increased risk. You will have tests every 1-10 years, depending on your results and the type of screening test.  Diabetes screening. This is done by checking your blood sugar (glucose) after you have not eaten for a while (fasting). You may have this   done every 1-3 years.  Mammogram. This may be done every 49-2 years. Talk with your health care provider about when you should start having regular mammograms. This may depend on whether you have a family history of breast cancer.  BRCA-related cancer screening. This may be done if you have a family history of breast, ovarian, tubal, or peritoneal  cancers.  Pelvic exam and Pap test. This may be done every 3 years starting at age 23. Starting at age 49, this may be done every 5 years if you have a Pap test in combination with an HPV test. Other tests  Sexually transmitted disease (STD) testing.  Bone density scan. This is done to screen for osteoporosis. You may have this scan if you are at high risk for osteoporosis. Follow these instructions at home: Eating and drinking  Eat a diet that includes fresh fruits and vegetables, whole grains, lean protein, and low-fat dairy.  Take vitamin and mineral supplements as recommended by your health care provider.  Do not drink alcohol if: ? Your health care provider tells you not to drink. ? You are pregnant, may be pregnant, or are planning to become pregnant.  If you drink alcohol: ? Limit how much you have to 0-1 drink a day. ? Be aware of how much alcohol is in your drink. In the [place] one drink equals one 12 oz bottle of beer (355 mL), one 5 oz glass of wine (148 mL), or one 1 oz glass of hard liquor (44 mL). Lifestyle  Take daily care of your teeth and gums.  Stay active. Exercise for at least 30 minutes on 5 or more days each week.  Do not use any products that contain nicotine or tobacco, such as cigarettes, e-cigarettes, and chewing tobacco. If you need help quitting, ask your health care provider.  If you are sexually active, practice safe sex. Use a condom or other form of birth control (contraception) in order to prevent pregnancy and STIs (sexually transmitted infections).  If told by your health care provider, take low-dose aspirin daily starting at age 49. What's next?  Visit your health care provider once a year for a well check visit.  Ask your health care provider how often you should have your eyes and teeth checked.  Stay up to date on all vaccines. This information is not intended to replace advice given to you by your health care provider. Make sure you  discuss any questions you have with your health care provider. Document Released: 10/07/2015 Document Revised: 05/22/2018 Document Reviewed: 05/22/2018 Elsevier Patient Education  2020 [place] self-awareness is knowing how your breasts look and feel. Doing breast self-awareness is important. It allows you to catch a breast problem early while it is still small and can be treated. All women should do breast self-awareness, including women who have had breast implants. Tell your doctor if you notice a change in your breasts. What you need:  A mirror.  A well-lit room. How to do a breast self-exam A breast self-exam is one way to learn what is normal for your breasts and to check for changes. To do a breast self-exam: Look for changes  1. Take off all the clothes above your waist. 2. Stand in front of a mirror in a room with good lighting. 3. Put your hands on your hips. 4. Push your hands down. 5. Look at your breasts and nipples in the mirror to see if one breast or nipple looks  different from the other. Check to see if: ? The shape of one breast is different. ? The size of one breast is different. ? There are wrinkles, dips, and bumps in one breast and not the other. 6. Look at each breast for changes in the skin, such as: ? Redness. ? Scaly areas. 7. Look for changes in your nipples, such as: ? Liquid around the nipples. ? Bleeding. ? Dimpling. ? Redness. ? A change in where the nipples are. Feel for changes  1. Lie on your back on the floor. 2. Feel each breast. To do this, follow these steps: ? Pick a breast to feel. ? Put the arm closest to that breast above your head. ? Use your other arm to feel the nipple area of your breast. Feel the area with the pads of your three middle fingers by making small circles with your fingers. For the first circle, press lightly. For the second circle, press harder. For the third circle, press even harder.  ? Keep making circles with your fingers at the different pressures as you move down your breast. Stop when you feel your ribs. ? Move your fingers a little toward the center of your body. ? Start making circles with your fingers again, this time going up until you reach your collarbone. ? Keep making up-and-down circles until you reach your armpit. Remember to keep using the three pressures. ? Feel the other breast in the same way. 3. Sit or stand in the tub or shower. 4. With soapy water on your skin, feel each breast the same way you did in step 2 when you were lying on the floor. Write down what you find Writing down what you find can help you remember what to tell your doctor. Write down:  What is normal for each breast.  Any changes you find in each breast, including: ? The kind of changes you find. ? Whether you have pain. ? Size and location of any lumps.  When you last had your menstrual period. General tips  Check your breasts every month.  If you are breastfeeding, the best time to check your breasts is after you feed your baby or after you use a breast pump.  If you get menstrual periods, the best time to check your breasts is 5-7 days after your menstrual period is over.  With time, you will become comfortable with the self-exam, and you will begin to know if there are changes in your breasts. Contact a doctor if you:  See a change in the shape or size of your breasts or nipples.  See a change in the skin of your breast or nipples, such as red or scaly skin.  Have fluid coming from your nipples that is not normal.  Find a lump or thick area that was not there before.  Have pain in your breasts.  Have any concerns about your breast health. Summary  Breast self-awareness includes looking for changes in your breasts, as well as feeling for changes within your breasts.  Breast self-awareness should be done in front of a mirror in a well-lit room.  You should  check your breasts every month. If you get menstrual periods, the best time to check your breasts is 5-7 days after your menstrual period is over.  Let your doctor know of any changes you see in your breasts, including changes in size, changes on the skin, pain or tenderness, or fluid from your nipples that is not normal. This  information is not intended to replace advice given to you by your health care provider. Make sure you discuss any questions you have with your health care provider. Document Released: 02/27/2008 Document Revised: 04/29/2018 Document Reviewed: 04/29/2018 Elsevier Patient Education  2020 [employer].

## 2019-04-14 NOTE — Progress Notes (Signed)
Pt is present today for annual exam. Pt stated that she was doing well.  

## 2019-04-14 NOTE — Progress Notes (Signed)
GYNECOLOGY ANNUAL PHYSICAL EXAM PROGRESS NOTE  Subjective:    Valerie Mcdaniel is a 48 y.o. G2P2 female who presents for an annual exam. The patient is sexually active. The patient wears seatbelts: yes. The patient participates in regular exercise: no. Has the patient ever been transfused or tattooed?: no. The patient reports that there is not domestic violence in her life.    The patient has the following complaints today:  1. Complains of hot flushes and night sweats despite use of Effexor. Has been on Effexor for ~ 1 year. Notes that initially it was helping, however now she has begun experiencing them again for the past several months.   Gynecologic History  Menarche age: 88 No LMP recorded. Patient has had an ablation. Contraception: none History of STI's: Denies Last Pap: 03/2018. Results were: normal.  Denies/ h/o abnormal pap smears. Last mammogram: 07/2018. Results were: normal.  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    OB History  Gravida Para Term Preterm AB Living  2 2 0 0 0 2  SAB TAB Ectopic Multiple Live Births  0 0 0 0 0    # Outcome Date GA Lbr Len/2nd Weight Sex Delivery Anes PTL Lv  2 Para           1 Para             Obstetric Comments  1st Menstrual Cycle:  11   1st Pregnancy:  18    Past Medical History:  Diagnosis Date  . Cancer [facility]) 2014   right breast, DCIS, ER/PR positive  . GERD (gastroesophageal reflux disease)   . Hypertension   . Personal history of radiation therapy 2014   RIGHT lumpectomy  . TIA (transient ischemic attack)     Past Surgical History:  Procedure Laterality Date  . ABLATION  01-03-15   D & C  . BREAST BIOPSY Right 2014   +  . BREAST LUMPECTOMY Right 2014   right breast, DCIS, ER/PR positive  . BREAST SURGERY Right 06/29/13   lumpectomy  . CESAREAN SECTION  March 1997  . NECK LESION BIOPSY  1989    Family History  Problem Relation Age of Onset  . Cancer Father    lung  . Cancer Maternal Aunt        breast  . Breast cancer Maternal Aunt   . Cancer Maternal Grandmother        throat    Social History   Socioeconomic History  . Marital status: Married    Spouse name: Not on file  . Number of children: Not on file  . Years of education: Not on file  . Highest education level: Not on file  Occupational History  . Not on file  Social Needs  . Financial resource strain: Not on file  . Food insecurity    Worry: Not on file    Inability: Not on file  . Transportation needs    Medical: Not on file    Non-medical: Not on file  Tobacco Use  . Smoking status: Current Every Day Smoker    Packs/day: 1.00    Types: Cigarettes  . Smokeless tobacco: Never Used  Substance and Sexual Activity  . Alcohol use: No  . Drug use: No  . Sexual activity: Not Currently    Birth control/protection: None  Lifestyle  . Physical activity    Days per week: Not on file    Minutes  per session: Not on file  . Stress: Not on file  Relationships  . Social Herbalist on phone: Not on file    Gets together: Not on file    Attends religious service: Not on file    Active member of club or organization: Not on file    Attends meetings of clubs or organizations: Not on file    Relationship status: Not on file  . Intimate partner violence    Fear of current or ex partner: Not on file    Emotionally abused: Not on file    Physically abused: Not on file    Forced sexual activity: Not on file  Other Topics Concern  . Not on file  Social History Narrative  . Not on file    Current Outpatient Medications on File Prior to Visit  Medication Sig Dispense Refill  . aspirin 325 MG tablet Take 325 mg by mouth daily.    . clopidogrel (PLAVIX) 75 MG tablet Take 75 mg by mouth daily.    . hydrOXYzine (ATARAX/VISTARIL) 50 MG tablet Take 1 tablet (50 mg total) by mouth 3 (three) times daily as needed. 45 tablet 3  . lisinopril (PRINIVIL,ZESTRIL) 10 MG tablet  Take 10 mg by mouth daily.    . Multiple Vitamin (MULTIVITAMIN) capsule Take 1 capsule by mouth daily.    . pantoprazole (PROTONIX) 40 MG tablet Take 40 mg by mouth daily.    . pravastatin (PRAVACHOL) 20 MG tablet Take 20 mg by mouth daily.    Marland Kitchen terbinafine (LAMISIL) 250 MG tablet Take 250 mg by mouth daily.    Marland Kitchen venlafaxine XR (EFFEXOR-XR) 75 MG 24 hr capsule TAKE 1 CAPSULE BY MOUTH ONCE DAILY 30 capsule 10   No current facility-administered medications on file prior to visit.     No Known Allergies    Review of Systems Constitutional: negative for chills, fatigue, fevers and sweats Eyes: negative for irritation, redness and visual disturbance Ears, nose, mouth, throat, and face: negative for hearing loss, nasal congestion, snoring and tinnitus Respiratory: negative for asthma, cough, sputum Cardiovascular: negative for chest pain, dyspnea, exertional chest pressure/discomfort, irregular heart beat, palpitations and syncope Gastrointestinal: negative for abdominal pain, change in bowel habits, nausea and vomiting Genitourinary: negative for abnormal menstrual periods, genital lesions, sexual problems and vaginal discharge, dysuria and urinary incontinence. Positive for hot flushes, night sweats Integument/breast: negative for breast lump, breast tenderness and nipple discharge Hematologic/lymphatic: negative for bleeding and easy bruising Musculoskeletal:negative for back pain and muscle weakness Neurological: negative for dizziness, headaches, vertigo and weakness Endocrine: negative for diabetic symptoms including polydipsia, polyuria and skin dryness Allergic/Immunologic: negative for hay fever and urticaria      Objective:  Blood pressure 110/76, pulse 80, height 5\' 6"  (1.676 m), weight 186 lb (84.4 kg). Body mass index is 30.02 kg/m.  General Appearance:    Alert, cooperative, no distress, appears stated age, obesity (mild).   Head:    Normocephalic, without obvious  abnormality, atraumatic  Eyes:    PERRL, conjunctiva/corneas clear, EOM's intact, both eyes  Ears:    Normal external ear canals, both ears  Nose:   Nares normal, septum midline, mucosa normal, no drainage or sinus tenderness  Throat:   Lips, mucosa, and tongue normal; teeth and gums normal  Neck:   Supple, symmetrical, trachea midline, no adenopathy; thyroid: no enlargement/tenderness/nodules; no carotid bruit or JVD  Back:     Symmetric, no curvature, ROM normal, no CVA tenderness  Lungs:  Clear to auscultation bilaterally, respirations unlabored  Chest Wall:    No tenderness or deformity   Heart:    Regular rate and rhythm, S1 and S2 normal, no murmur, rub or gallop  Breast Exam:    No tenderness, masses, or nipple abnormality  Abdomen:     Soft, non-tender, bowel sounds active all four quadrants, no masses, no organomegaly.    Genitalia:    Pelvic:external genitalia normal, vagina without lesions, discharge, or tenderness, rectovaginal septum  normal. Cervix normal in appearance, no cervical motion tenderness, no adnexal masses or tenderness.  Uterus normal size, shape, mobile, regular contours, nontender.  Rectal:    Normal external sphincter.  No hemorrhoids appreciated. Internal exam not done.   Extremities:   Extremities normal, atraumatic, no cyanosis or edema  Pulses:   2+ and symmetric all extremities  Skin:   Skin color, texture, turgor normal, no rashes or lesions  Lymph nodes:   Cervical, supraclavicular, and axillary nodes normal  Neurologic:   CNII-XII intact, normal strength, sensation and reflexes throughout   .  Labs:  Lab Results  Component Value Date   WBC 8.5 04/09/2018   HGB 14.7 04/09/2018   HCT 42.6 04/09/2018   MCV 95 04/09/2018   PLT 321 04/09/2018    Lab Results  Component Value Date   CREATININE 0.80 04/09/2018   BUN 8 04/09/2018   NA 137 04/09/2018   K 4.4 04/09/2018   CL 100 04/09/2018   CO2 23 04/09/2018    Lab Results  Component Value  Date   ALT 14 04/09/2018   AST 13 04/09/2018   ALKPHOS 123 (H) 04/09/2018   BILITOT 0.3 04/09/2018    Lab Results  Component Value Date   TSH 1.700 04/09/2018     Assessment:    Healthy female exam.   Perimenopausal vasomotor symptoms  H/o breast cancer Overweight.  Dyslipidemia  Plan:     Blood tests: None ordered. Will have labs done by PCP.  Breast self exam technique reviewed and patient encouraged to perform self-exam monthly. Mammogram up to date. Continue to f/u yearly with General Surgery for h/o breast cancer.  Pap smear up to date. Next due in 2022.   Contraception: none. Discussed healthy lifestyle modifications. Patient with bothersome perimenopausal vasomotor symptoms despite use of Effexor. .   Patient is not a candidate for HRT due to her h/o breast cancer and TIA. Has tried Paxil and black cohosh in the past. Discussed other medical options such Clonidine,  or Neurontin.   Also discussed alternative therapies such as herbal remedies but cautioned that most of the products contained phytoestrogens (plant estrogens) in unregulated amounts which can have the same effects on the body as the pharmaceutical estrogen preparations. To continue Effexor and will try herbal remedy supplementation for now. If no improvement in symptoms, will consider Gabapentin. Follow up in 1 year for annual exam.    Rubie Maid, MD [facility]

## 2019-04-15 ENCOUNTER — Encounter: Payer: Self-pay | Admitting: General Surgery

## 2019-06-08 ENCOUNTER — Other Ambulatory Visit: Payer: Self-pay

## 2019-06-08 DIAGNOSIS — Z1231 Encounter for screening mammogram for malignant neoplasm of breast: Secondary | ICD-10-CM

## 2019-07-26 ENCOUNTER — Other Ambulatory Visit: Payer: Self-pay | Admitting: Obstetrics and Gynecology

## 2019-07-27 ENCOUNTER — Ambulatory Visit
Admission: RE | Admit: 2019-07-27 | Discharge: 2019-07-27 | Disposition: A | Discharge status: Home / Self Care | Payer: BLUE CROSS/BLUE SHIELD | Source: Ambulatory Visit | Attending: Surgery | Admitting: Surgery

## 2019-07-27 DIAGNOSIS — Z1231 Encounter for screening mammogram for malignant neoplasm of breast: Secondary | ICD-10-CM | POA: Insufficient documentation

## 2019-07-28 ENCOUNTER — Telehealth: Payer: Self-pay

## 2019-07-28 NOTE — Telephone Encounter (Signed)
Patient notified of normal mammogram and reminded of follow up appointment 08/05/19 with Dr.Pabon.

## 2019-08-05 ENCOUNTER — Ambulatory Visit (INDEPENDENT_AMBULATORY_CARE_PROVIDER_SITE_OTHER): Payer: BLUE CROSS/BLUE SHIELD | Admitting: Surgery

## 2019-08-05 ENCOUNTER — Encounter: Payer: Self-pay | Admitting: Surgery

## 2019-08-05 ENCOUNTER — Other Ambulatory Visit: Payer: Self-pay

## 2019-08-05 VITALS — BP 133/82 | HR 92 | Temp 97.5°F | Resp 14 | Ht 65.0 in | Wt 194.8 lb

## 2019-08-05 DIAGNOSIS — D0511 Intraductal carcinoma in situ of right breast: Secondary | ICD-10-CM

## 2019-08-05 DIAGNOSIS — G459 Transient cerebral ischemic attack, unspecified: Secondary | ICD-10-CM | POA: Insufficient documentation

## 2019-08-05 NOTE — Patient Instructions (Signed)
We will contact you October 2021 to schedule the screening mammogram and breast exam for November 2021.   Continue with self breast exams.

## 2019-08-07 NOTE — Progress Notes (Signed)
Outpatient Surgical Follow Up  08/07/2019  Valerie Mcdaniel is an 48 y.o. female.   Chief Complaint  Patient presents with  . Follow-up    bil dx mammogram    HPI: Valerie Mcdaniel is a 48 year old female with a prior history of right DCIS ER and PR positive status post lumpectomy and radiation therapy in 2014 by Dr. Jamal Collin.Marland Kitchen Hx TIA, not antihormonal rx. She denies any new masses.  No lymphadenopathy.  No fevers no chills no weight loss. Recent mammogram personally reviewed there is no evidence of new concerning lesions  Past Medical History:  Diagnosis Date  . Cancer [facility]) 2014   right breast, DCIS, ER/PR positive  . GERD (gastroesophageal reflux disease)   . Hypertension   . Personal history of radiation therapy 2014   RIGHT lumpectomy  . TIA (transient ischemic attack)     Past Surgical History:  Procedure Laterality Date  . ABLATION  01-03-15   D & C  . BREAST BIOPSY Right 2014   +  . BREAST LUMPECTOMY Right 2014   right breast, DCIS, ER/PR positive  . BREAST SURGERY Right 06/29/13   lumpectomy  . CESAREAN SECTION  March 1997  . NECK LESION BIOPSY  1989    Family History  Problem Relation Age of Onset  . Cancer Father        lung  . Cancer Maternal Aunt        breast  . Breast cancer Maternal Aunt   . Cancer Maternal Grandmother        throat    Social History:  reports that she has been smoking cigarettes. She has been smoking about 1.00 pack per day. She has never used smokeless tobacco. She reports that she does not drink alcohol or use drugs.  Allergies: No Known Allergies  Medications reviewed.    ROS Full ROS performed and is otherwise negative other than what is stated in HPI   BP 133/82   Pulse 92   Temp (!) 97.5 F (36.4 C) (Temporal)   Resp 14   Ht 5\' 5"  (1.651 m)   Wt 194 lb 12.8 oz (88.4 kg)   SpO2 98%   BMI 32.42 kg/m   Physical Exam Vitals signs and nursing note reviewed. Exam conducted with a chaperone present.  Constitutional:    General: She is not in acute distress.    Appearance: Normal appearance. She is normal weight.  Eyes:     General: No scleral icterus.       Right eye: No discharge.        Left eye: No discharge.  Neck:     Musculoskeletal: Normal range of motion and neck supple. No neck rigidity or muscular tenderness.  Pulmonary:     Effort: Pulmonary effort is normal. No respiratory distress.     Breath sounds: No wheezing.     Comments: BREAST: Evidence of previous lumpectomy scar with some radiation changes within the right breast.  No evidence of new masses.  Left breast is completely normal.  No evidence of lymphadenopathy Abdominal:     General: Abdomen is flat. There is no distension.     Palpations: Abdomen is soft. There is no mass.     Tenderness: There is no abdominal tenderness. There is no guarding or rebound.     Hernia: No hernia is present.  Lymphadenopathy:     Cervical: No cervical adenopathy.  Skin:    General: Skin is warm and dry.     Capillary  Refill: Capillary refill takes less than 2 seconds.  Neurological:     General: No focal deficit present.     Mental Status: She is alert and oriented to person, place, and time.  Psychiatric:        Mood and Affect: Mood normal.        Behavior: Behavior normal.        Thought Content: Thought content normal.        Judgment: Judgment normal.       Assessment/Plan: 48 year old female with a history of right DCIS currently no evidence of new concerning lesions.  I will see her back in 1 year with routine mammogram and physical exam. Greater than 50% of the 25 minutes  visit was spent in counseling/coordination of care   Valerie Hamman, MD [facility]

## 2020-04-14 ENCOUNTER — Ambulatory Visit (INDEPENDENT_AMBULATORY_CARE_PROVIDER_SITE_OTHER): Payer: BLUE CROSS/BLUE SHIELD | Admitting: Obstetrics and Gynecology

## 2020-04-14 ENCOUNTER — Encounter: Payer: Self-pay | Admitting: Obstetrics and Gynecology

## 2020-04-14 ENCOUNTER — Other Ambulatory Visit: Payer: Self-pay

## 2020-04-14 VITALS — BP 111/74 | HR 94 | Ht 65.0 in | Wt 190.3 lb

## 2020-04-14 DIAGNOSIS — Z853 Personal history of malignant neoplasm of breast: Secondary | ICD-10-CM | POA: Diagnosis not present

## 2020-04-14 DIAGNOSIS — Z1231 Encounter for screening mammogram for malignant neoplasm of breast: Secondary | ICD-10-CM | POA: Diagnosis not present

## 2020-04-14 DIAGNOSIS — N951 Menopausal and female climacteric states: Secondary | ICD-10-CM | POA: Diagnosis not present

## 2020-04-14 DIAGNOSIS — Z01419 Encounter for gynecological examination (general) (routine) without abnormal findings: Secondary | ICD-10-CM | POA: Diagnosis not present

## 2020-04-14 DIAGNOSIS — E669 Obesity, unspecified: Secondary | ICD-10-CM

## 2020-04-14 DIAGNOSIS — Z1211 Encounter for screening for malignant neoplasm of colon: Secondary | ICD-10-CM

## 2020-04-14 DIAGNOSIS — Z9889 Other specified postprocedural states: Secondary | ICD-10-CM

## 2020-04-14 NOTE — Progress Notes (Signed)
Pt present for annual exam. Pt stated that she is still having hot flashes. Pt declined covid vaccine. Pt due for colonoscopy order placed for cologuard.

## 2020-04-14 NOTE — Progress Notes (Signed)
GYNECOLOGY ANNUAL PHYSICAL EXAM PROGRESS NOTE  Subjective:    Valerie Mcdaniel is a 49 y.o. G2P2 female who presents for an annual exam. The patient is not sexually active. The patient wears seatbelts: yes. The patient participates in regular exercise: no. Has the patient ever been transfused or tattooed?: no. The patient reports that there is not domestic violence in her life.  The patient has the following complaints today:  1. Complains of hot flushes and night sweats. Patient states she is still taking the Effexor.Patient states it will help mildly, but she is not interested in changing medications or increasing the dose at this time.  Is also taking black cohosh.    Gynecologic History  Menarche age: 37 Contraception: tubal ligation  History of STI's: Denies Last Pap with HPV: 03/2018, normal. Denies/ h/o abnormal pap smears. Last mammogram: 07/2019, normal. History of carcinoma in situ of the right breast.  Status post lumpectomy in 2014.    OB History  Gravida Para Term Preterm AB Living  2 2 0 0 0 2  SAB TAB Ectopic Multiple Live Births  0 0 0 0 0    # Outcome Date GA Lbr Len/2nd Weight Sex Delivery Anes PTL Lv  2 Para           1 Para             Obstetric Comments  1st Menstrual Cycle:  11   1st Pregnancy:  18    Past Medical History:  Diagnosis Date  . Cancer [facility]) 2014   right breast, DCIS, ER/PR positive  . GERD (gastroesophageal reflux disease)   . Hypertension   . Personal history of radiation therapy 2014   RIGHT lumpectomy  . TIA (transient ischemic attack)     Past Surgical History:  Procedure Laterality Date  . ABLATION  01-03-15   D & C  . BREAST BIOPSY Right 2014   +  . BREAST LUMPECTOMY Right 2014   right breast, DCIS, ER/PR positive  . BREAST SURGERY Right 06/29/13   lumpectomy  . CESAREAN SECTION  March 1997  . NECK LESION BIOPSY  1989    Family History  Problem Relation Age of Onset  . Cancer Father        lung  . Cancer  Maternal Aunt        breast  . Breast cancer Maternal Aunt   . Cancer Maternal Grandmother        throat    Social History   Socioeconomic History  . Marital status: Married    Spouse name: Not on file  . Number of children: Not on file  . Years of education: Not on file  . Highest education level: Not on file  Occupational History  . Not on file  Tobacco Use  . Smoking status: Current Every Day Smoker    Packs/day: 1.00    Types: Cigarettes  . Smokeless tobacco: Never Used  Vaping Use  . Vaping Use: Never used  Substance and Sexual Activity  . Alcohol use: No  . Drug use: No  . Sexual activity: Not Currently    Birth control/protection: None  Other Topics Concern  . Not on file  Social History Narrative  . Not on file   Social Determinants of Health   Financial Resource Strain:   . Difficulty of Paying Living Expenses:   Food Insecurity:   . Worried About Charity fundraiser in the Last Year:   .  Ran Out of Food in the Last Year:   Transportation Needs:   . Film/video editor (Medical):   Marland Kitchen Lack of Transportation (Non-Medical):   Physical Activity:   . Days of Exercise per Week:   . Minutes of Exercise per Session:   Stress:   . Feeling of Stress :   Social Connections:   . Frequency of Communication with Friends and Family:   . Frequency of Social Gatherings with Friends and Family:   . Attends Religious Services:   . Active Member of Clubs or Organizations:   . Attends Archivist Meetings:   Marland Kitchen Marital Status:   Intimate Partner Violence:   . Fear of Current or Ex-Partner:   . Emotionally Abused:   Marland Kitchen Physically Abused:   . Sexually Abused:     Current Outpatient Medications on File Prior to Visit  Medication Sig Dispense Refill  . aspirin 325 MG tablet Take 325 mg by mouth daily.    Marland Kitchen BLACK COHOSH PO Take 540 mg by mouth.    . clopidogrel (PLAVIX) 75 MG tablet Take 75 mg by mouth daily.    Marland Kitchen icosapent Ethyl (VASCEPA) 1 g capsule Take  2 g by mouth 2 (two) times daily.    Marland Kitchen lisinopril (PRINIVIL,ZESTRIL) 10 MG tablet Take 10 mg by mouth daily.    . Multiple Vitamin (MULTIVITAMIN) capsule Take 1 capsule by mouth daily.    . pantoprazole (PROTONIX) 40 MG tablet Take 40 mg by mouth daily.    . pravastatin (PRAVACHOL) 20 MG tablet Take 20 mg by mouth daily.    Marland Kitchen venlafaxine XR (EFFEXOR-XR) 75 MG 24 hr capsule Take 1 capsule by mouth once daily 90 capsule 4   No current facility-administered medications on file prior to visit.    No Known Allergies    Review of Systems Constitutional: negative for chills, fatigue, fevers and sweats Eyes: negative for irritation, redness and visual disturbance Ears, nose, mouth, throat, and face: negative for hearing loss, nasal congestion, snoring and tinnitus Respiratory: negative for asthma, cough, sputum Cardiovascular: negative for chest pain, dyspnea, exertional chest pressure/discomfort, irregular heart beat, palpitations and syncope Gastrointestinal: negative for abdominal pain, change in bowel habits, nausea and vomiting Genitourinary: negative for abnormal menstrual periods, genital lesions, sexual problems and vaginal discharge, dysuria and urinary incontinence. Positive for hot flushes, night sweats Integument/breast: negative for breast lump, breast tenderness and nipple discharge Hematologic/lymphatic: negative for bleeding and easy bruising Musculoskeletal:negative for back pain and muscle weakness Neurological: negative for dizziness, headaches, vertigo and weakness Endocrine: negative for diabetic symptoms including polydipsia, polyuria and skin dryness Allergic/Immunologic: negative for hay fever and urticaria      Objective:  Blood pressure 111/74, pulse 94, height 5\' 5"  (1.651 m), weight 86.3 kg. Body mass index is 31.67 kg/m.  General Appearance:    Alert, cooperative, no distress, appears stated age, mild obesity  Head:    Normocephalic, without obvious  abnormality, atraumatic  Eyes:    PERRL, conjunctiva/corneas clear, EOM's intact, both eyes  Ears:    Normal external ear canals, both ears  Nose:   Nares normal, septum midline, mucosa normal, no drainage or sinus tenderness  Throat:   Lips, mucosa, and tongue normal; teeth and gums normal  Neck:   Supple, symmetrical, trachea midline, no adenopathy; thyroid: no enlargement/tenderness/nodules; no carotid bruit or JVD  Back:     Symmetric, no curvature, ROM normal, no CVA tenderness  Lungs:     Clear to auscultation bilaterally, respirations  unlabored  Chest Wall:    No tenderness or deformity   Heart:    Regular rate and rhythm, S1 and S2 normal, no murmur, rub or gallop  Breast Exam:    No tenderness, masses, or nipple abnormality  Abdomen:     Soft, non-tender, bowel sounds active all four quadrants, no masses, no organomegaly.    Genitalia:    Pelvic:external genitalia normal, vagina without lesions, discharge, or tenderness, rectovaginal septum  normal. Cervix normal in appearance, no cervical motion tenderness, no adnexal masses or tenderness.  Uterus normal size, shape, mobile, regular contours, nontender.  Rectal:    Normal external sphincter.  No hemorrhoids appreciated. Internal exam not done.   Extremities:   Extremities normal, atraumatic, no cyanosis or edema  Pulses:   2+ and symmetric all extremities  Skin:   Skin color, texture, turgor normal, no rashes or lesions  Lymph nodes:   Cervical, supraclavicular, and axillary nodes normal  Neurologic:   CNII-XII intact, normal strength, sensation and reflexes throughout   .  Labs:   Labs performed by PCP.   Assessment:   Healthy female exam.  Perimenopausal vasomotor symptoms  H/o breast cancer History of endometrial ablation  Obesity (mid).   Plan:    Blood tests: none- PCP does her labs and she has follow up with him in August. Breast self exam technique reviewed and patient encouraged to perform self-exam  monthly. Contraception: tubal ligation Discussed healthy lifestyle modifications. Mammogram up to date and order placed. Continue to f/u yearly with General Surgery for h/o breast cancer.  Cologuard ordered for Colon Cancer screening. Patient will call when she sends the test in and we will notify her of her results.  Hot flashes not at goal on Effexor and Black Cohosh. Patient is not interested in changing medications or increasing dose at this time.  Follow up in 1 year for annual exam, or sooner as needed.   I have seen and examined the patient with Doristine Locks, [place] PA-S.  I have reviewed the record and concur with patient management and plan.   Rubie Maid, MD [facility]

## 2020-04-14 NOTE — Patient Instructions (Signed)
Preventive Care 40-50 Years Old, Female °Preventive care refers to visits with your health care provider and lifestyle choices that can promote health and wellness. This includes: °· A yearly physical exam. This may also be called an annual well check. °· Regular dental visits and eye exams. °· Immunizations. °· Screening for certain conditions. °· Healthy lifestyle choices, such as eating a healthy diet, getting regular exercise, not using drugs or products that contain nicotine and tobacco, and limiting alcohol use. °What can I expect for my preventive care visit? °Physical exam °Your health care provider will check your: °· Height and weight. This may be used to calculate body mass index (BMI), which tells if you are at a healthy weight. °· Heart rate and blood pressure. °· Skin for abnormal spots. °Counseling °Your health care provider may ask you questions about your: °· Alcohol, tobacco, and drug use. °· Emotional well-being. °· Home and relationship well-being. °· Sexual activity. °· Eating habits. °· Work and work environment. °· Method of birth control. °· Menstrual cycle. °· Pregnancy history. °What immunizations do I need? ° °Influenza (flu) vaccine °· This is recommended every year. °Tetanus, diphtheria, and pertussis (Tdap) vaccine °· You may need a Td booster every 10 years. °Varicella (chickenpox) vaccine °· You may need this if you have not been vaccinated. °Zoster (shingles) vaccine °· You may need this after age 60. °Measles, mumps, and rubella (MMR) vaccine °· You may need at least one dose of MMR if you were born in 1958 or later. You may also need a second dose. °Pneumococcal conjugate (PCV13) vaccine °· You may need this if you have certain conditions and were not previously vaccinated. °Pneumococcal polysaccharide (PPSV23) vaccine °· You may need one or two doses if you smoke cigarettes or if you have certain conditions. °Meningococcal conjugate (MenACWY) vaccine °· You may need this if you  have certain conditions. °Hepatitis A vaccine °· You may need this if you have certain conditions or if you travel or work in places where you may be exposed to hepatitis A. °Hepatitis B vaccine °· You may need this if you have certain conditions or if you travel or work in places where you may be exposed to hepatitis B. °Haemophilus influenzae type b (Hib) vaccine °· You may need this if you have certain conditions. °Human papillomavirus (HPV) vaccine °· If recommended by your health care provider, you may need three doses over 6 months. °You may receive vaccines as individual doses or as more than one vaccine together in one shot (combination vaccines). Talk with your health care provider about the risks and benefits of combination vaccines. °What tests do I need? °Blood tests °· Lipid and cholesterol levels. These may be checked every 5 years, or more frequently if you are over 50 years old. °· Hepatitis C test. °· Hepatitis B test. °Screening °· Lung cancer screening. You may have this screening every year starting at age 55 if you have a 30-pack-year history of smoking and currently smoke or have quit within the past 15 years. °· Colorectal cancer screening. All adults should have this screening starting at age 50 and continuing until age 75. Your health care provider may recommend screening at age 45 if you are at increased risk. You will have tests every 1-10 years, depending on your results and the type of screening test. °· Diabetes screening. This is done by checking your blood sugar (glucose) after you have not eaten for a while (fasting). You may have this   done every 1-3 years. °· Mammogram. This may be done every 1-2 years. Talk with your health care provider about when you should start having regular mammograms. This may depend on whether you have a family history of breast cancer. °· BRCA-related cancer screening. This may be done if you have a family history of breast, ovarian, tubal, or peritoneal  cancers. °· Pelvic exam and Pap test. This may be done every 3 years starting at age 21. Starting at age 30, this may be done every 5 years if you have a Pap test in combination with an HPV test. °Other tests °· Sexually transmitted disease (STD) testing. °· Bone density scan. This is done to screen for osteoporosis. You may have this scan if you are at high risk for osteoporosis. °Follow these instructions at home: °Eating and drinking °· Eat a diet that includes fresh fruits and vegetables, whole grains, lean protein, and low-fat dairy. °· Take vitamin and mineral supplements as recommended by your health care provider. °· Do not drink alcohol if: °? Your health care provider tells you not to drink. °? You are pregnant, may be pregnant, or are planning to become pregnant. °· If you drink alcohol: °? Limit how much you have to 0-1 drink a day. °? Be aware of how much alcohol is in your drink. In the [place] one drink equals one 12 oz bottle of beer (355 mL), one 5 oz glass of wine (148 mL), or one 1½ oz glass of hard liquor (44 mL). °Lifestyle °· Take daily care of your teeth and gums. °· Stay active. Exercise for at least 30 minutes on 5 or more days each week. °· Do not use any products that contain nicotine or tobacco, such as cigarettes, e-cigarettes, and chewing tobacco. If you need help quitting, ask your health care provider. °· If you are sexually active, practice safe sex. Use a condom or other form of birth control (contraception) in order to prevent pregnancy and STIs (sexually transmitted infections). °· If told by your health care provider, take low-dose aspirin daily starting at age 50. °What's next? °· Visit your health care provider once a year for a well check visit. °· Ask your health care provider how often you should have your eyes and teeth checked. °· Stay up to date on all vaccines. °This information is not intended to replace advice given to you by your health care provider. Make sure you  discuss any questions you have with your health care provider. °Document Revised: 05/22/2018 Document Reviewed: 05/22/2018 °[employer] Patient Education © 2020 [employer] °Breast Self-Awareness °Breast self-awareness is knowing how your breasts look and feel. Doing breast self-awareness is important. It allows you to catch a breast problem early while it is still small and can be treated. All women should do breast self-awareness, including women who have had breast implants. Tell your doctor if you notice a change in your breasts. °What you need: °· A mirror. °· A well-lit room. °How to do a breast self-exam °A breast self-exam is one way to learn what is normal for your breasts and to check for changes. To do a breast self-exam: °Look for changes ° °1. Take off all the clothes above your waist. °2. Stand in front of a mirror in a room with good lighting. °3. Put your hands on your hips. °4. Push your hands down. °5. Look at your breasts and nipples in the mirror to see if one breast or nipple looks different from the   other. Check to see if: °? The shape of one breast is different. °? The size of one breast is different. °? There are wrinkles, dips, and bumps in one breast and not the other. °6. Look at each breast for changes in the skin, such as: °? Redness. °? Scaly areas. °7. Look for changes in your nipples, such as: °? Liquid around the nipples. °? Bleeding. °? Dimpling. °? Redness. °? A change in where the nipples are. °Feel for changes ° °1. Lie on your back on the floor. °2. Feel each breast. To do this, follow these steps: °? Pick a breast to feel. °? Put the arm closest to that breast above your head. °? Use your other arm to feel the nipple area of your breast. Feel the area with the pads of your three middle fingers by making small circles with your fingers. For the first circle, press lightly. For the second circle, press harder. For the third circle, press even harder. °? Keep making circles with  your fingers at the different pressures as you move down your breast. Stop when you feel your ribs. °? Move your fingers a little toward the center of your body. °? Start making circles with your fingers again, this time going up until you reach your collarbone. °? Keep making up-and-down circles until you reach your armpit. Remember to keep using the three pressures. °? Feel the other breast in the same way. °3. Sit or stand in the tub or shower. °4. With soapy water on your skin, feel each breast the same way you did in step 2 when you were lying on the floor. °Write down what you find °Writing down what you find can help you remember what to tell your doctor. Write down: °· What is normal for each breast. °· Any changes you find in each breast, including: °? The kind of changes you find. °? Whether you have pain. °? Size and location of any lumps. °· When you last had your menstrual period. °General tips °· Check your breasts every month. °· If you are breastfeeding, the best time to check your breasts is after you feed your baby or after you use a breast pump. °· If you get menstrual periods, the best time to check your breasts is 5-7 days after your menstrual period is over. °· With time, you will become comfortable with the self-exam, and you will begin to know if there are changes in your breasts. °Contact a doctor if you: °· See a change in the shape or size of your breasts or nipples. °· See a change in the skin of your breast or nipples, such as red or scaly skin. °· Have fluid coming from your nipples that is not normal. °· Find a lump or thick area that was not there before. °· Have pain in your breasts. °· Have any concerns about your breast health. °Summary °· Breast self-awareness includes looking for changes in your breasts, as well as feeling for changes within your breasts. °· Breast self-awareness should be done in front of a mirror in a well-lit room. °· You should check your breasts every month.  If you get menstrual periods, the best time to check your breasts is 5-7 days after your menstrual period is over. °· Let your doctor know of any changes you see in your breasts, including changes in size, changes on the skin, pain or tenderness, or fluid from your nipples that is not normal. °This information is not   intended to replace advice given to you by your health care provider. Make sure you discuss any questions you have with your health care provider. °Document Revised: 04/29/2018 Document Reviewed: 04/29/2018 °[employer] Patient Education © 2020 [employer] ° °

## 2020-05-07 ENCOUNTER — Other Ambulatory Visit: Payer: Self-pay | Admitting: Obstetrics and Gynecology

## 2020-06-17 ENCOUNTER — Other Ambulatory Visit: Payer: Self-pay

## 2020-06-17 DIAGNOSIS — Z1231 Encounter for screening mammogram for malignant neoplasm of breast: Secondary | ICD-10-CM

## 2020-06-23 ENCOUNTER — Other Ambulatory Visit: Payer: Self-pay | Admitting: Obstetrics and Gynecology

## 2020-08-02 ENCOUNTER — Ambulatory Visit
Admission: RE | Admit: 2020-08-02 | Discharge: 2020-08-02 | Disposition: A | Discharge status: Home / Self Care | Payer: BLUE CROSS/BLUE SHIELD | Source: Ambulatory Visit | Attending: Surgery | Admitting: Surgery

## 2020-08-02 ENCOUNTER — Other Ambulatory Visit: Payer: Self-pay

## 2020-08-02 DIAGNOSIS — Z1231 Encounter for screening mammogram for malignant neoplasm of breast: Secondary | ICD-10-CM | POA: Insufficient documentation

## 2020-08-02 HISTORY — DX: Malignant neoplasm of unspecified site of unspecified female breast: C50.919

## 2020-08-10 ENCOUNTER — Ambulatory Visit: Payer: BLUE CROSS/BLUE SHIELD | Admitting: Surgery

## 2021-04-18 ENCOUNTER — Encounter: Payer: BLUE CROSS/BLUE SHIELD | Admitting: Obstetrics and Gynecology

## 2021-04-24 ENCOUNTER — Other Ambulatory Visit: Payer: Self-pay

## 2021-04-24 DIAGNOSIS — Z1231 Encounter for screening mammogram for malignant neoplasm of breast: Secondary | ICD-10-CM

## 2021-08-03 ENCOUNTER — Other Ambulatory Visit: Payer: Self-pay

## 2021-08-03 ENCOUNTER — Ambulatory Visit
Admission: RE | Admit: 2021-08-03 | Discharge: 2021-08-03 | Disposition: A | Discharge status: Home / Self Care | Payer: BLUE CROSS/BLUE SHIELD | Source: Ambulatory Visit

## 2021-08-03 DIAGNOSIS — Z1231 Encounter for screening mammogram for malignant neoplasm of breast: Secondary | ICD-10-CM | POA: Diagnosis present
